# Patient Record
Sex: Male | Born: 1990 | Race: Black or African American | Hispanic: No | Marital: Single | Smoking: Current every day smoker
Health system: Southern US, Community
[De-identification: ages and names within clinical notes are randomized; demographics above are authoritative.]

---
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
## 2010-01-23 ENCOUNTER — Emergency Department (HOSPITAL_COMMUNITY): Admission: EM | Admit: 2010-01-23 | Discharge: 2010-01-23 | Payer: Self-pay | Admitting: Emergency Medicine

## 2010-02-05 ENCOUNTER — Emergency Department (HOSPITAL_COMMUNITY): Admission: EM | Admit: 2010-02-05 | Discharge: 2010-02-06 | Payer: Self-pay | Admitting: Emergency Medicine

## 2010-07-18 ENCOUNTER — Emergency Department (HOSPITAL_COMMUNITY): Admission: EM | Admit: 2010-07-18 | Discharge: 2010-07-18 | Payer: Self-pay | Admitting: Emergency Medicine

## 2010-08-10 ENCOUNTER — Emergency Department (HOSPITAL_COMMUNITY): Admission: EM | Admit: 2010-08-10 | Discharge: 2010-08-10 | Payer: Self-pay | Admitting: Emergency Medicine

## 2011-09-06 LAB — EAR CULTURE

## 2011-11-04 ENCOUNTER — Emergency Department (HOSPITAL_COMMUNITY)
Admission: EM | Admit: 2011-11-04 | Discharge: 2011-11-04 | Discharge status: Against Medical Advice | Payer: Self-pay | Attending: Emergency Medicine | Admitting: Emergency Medicine

## 2011-11-04 ENCOUNTER — Encounter: Payer: Self-pay | Admitting: *Deleted

## 2011-11-04 DIAGNOSIS — R059 Cough, unspecified: Secondary | ICD-10-CM | POA: Insufficient documentation

## 2011-11-04 DIAGNOSIS — R070 Pain in throat: Secondary | ICD-10-CM | POA: Insufficient documentation

## 2011-11-04 DIAGNOSIS — R0602 Shortness of breath: Secondary | ICD-10-CM | POA: Insufficient documentation

## 2011-11-04 DIAGNOSIS — R42 Dizziness and giddiness: Secondary | ICD-10-CM | POA: Insufficient documentation

## 2011-11-04 DIAGNOSIS — R05 Cough: Secondary | ICD-10-CM | POA: Insufficient documentation

## 2011-11-04 DIAGNOSIS — R062 Wheezing: Secondary | ICD-10-CM | POA: Insufficient documentation

## 2011-11-04 MED ORDER — ALBUTEROL SULFATE (5 MG/ML) 0.5% IN NEBU
INHALATION_SOLUTION | RESPIRATORY_TRACT | Status: AC
  Filled 2011-11-04: qty 1

## 2011-11-04 NOTE — ED Notes (Signed)
Pt state that he started having difficulty breathing, SOB, sore throat, coughing , and congestion this past Saturday. Pt states that he took two albuterol nebulizer treatments prior to coming to the ER with no relief. Pt has inspiratory and expiratory wheezing from left lung, right lung has rhonchi. Pt states that he has been having a nonproductive cough. Pt states that he has had dizziness with the symptoms. Pt's throat is inflamed and red, no white blotches noted. Pt does state that he is having difficulty swallowing. Pt has a low grade fever of 99.85F at this time. O2 sats 98% on room air. Mom with patient. Pt has not taken any OTC meds for his symptoms.

## 2012-11-10 ENCOUNTER — Encounter (HOSPITAL_COMMUNITY): Payer: Self-pay | Admitting: Emergency Medicine

## 2012-11-10 ENCOUNTER — Emergency Department (INDEPENDENT_AMBULATORY_CARE_PROVIDER_SITE_OTHER)
Admission: EM | Admit: 2012-11-10 | Discharge: 2012-11-10 | Disposition: A | Discharge status: Home / Self Care | Payer: Medicaid Other | Source: Home / Self Care | Attending: Emergency Medicine | Admitting: Emergency Medicine

## 2012-11-10 DIAGNOSIS — K047 Periapical abscess without sinus: Secondary | ICD-10-CM

## 2012-11-10 MED ORDER — PENICILLIN V POTASSIUM 500 MG PO TABS: 500.0000 mg | ORAL_TABLET | Freq: Four times a day (QID) | ORAL | Status: DC

## 2012-11-10 MED ORDER — HYDROCODONE-ACETAMINOPHEN 5-325 MG PO TABS: ORAL_TABLET | ORAL | Status: DC

## 2012-11-10 MED ORDER — DICLOFENAC SODIUM 75 MG PO TBEC: 75.0000 mg | DELAYED_RELEASE_TABLET | Freq: Two times a day (BID) | ORAL | Status: DC

## 2012-11-10 NOTE — ED Provider Notes (Signed)
Chief Complaint  Patient presents with  . Dental Pain    History of Present Illness:   The patient is a 21 year old male who has had a one-week history of pain in his right ear and right jaw. It hurts him to chew. There's been no drainage from the ear or difficulty hearing. His right lower second molar has broken and it's tender. It's painful with swallowing. He has no trouble breathing. No fever, chills, headache, eye pain. No chest pain, neck pain, coughing, wheezing, or shortness of breath.  Review of Systems:  Other than noted above, the patient denies any of the following symptoms: Systemic:  No fever, chills, sweats or weight loss. ENT:  No headache, ear ache, sore throat, nasal congestion, facial pain, or swelling. Lymphatic:  No adenopathy. Lungs:  No coughing, wheezing or shortness of breath.  PMFSH:  Past medical history, family history, social history, meds, and allergies were reviewed.  Physical Exam:   Vital signs:  BP 136/93  Pulse 62  Temp 98.4 F (36.9 C) (Oral)  Resp 20  SpO2 100% General:  Alert, oriented, in no distress. ENT:  TMs and canals normal.  Nasal mucosa normal. Mouth exam:  The right lower second molar is decayed and tender to touch. There is no purulent drainage. No swelling of the gingiva. The pharynx is clear. There is no swelling of the floor the mouth.  Neck:  No swelling or adenopathy. Lungs:  Breath sounds clear and equal bilaterally.  No wheezes, rales or rhonchi. Heart:  Regular rhythm.  No gallops or murmers. Skin:  Clear, warm and dry.  Assessment:  The encounter diagnosis was Dental abscess.  Plan:   1.  The following meds were prescribed:   New Prescriptions   DICLOFENAC (VOLTAREN) 75 MG EC TABLET    Take 1 tablet (75 mg total) by mouth 2 (two) times daily.   HYDROCODONE-ACETAMINOPHEN (NORCO/VICODIN) 5-325 MG PER TABLET    1 to 2 tabs every 4 to 6 hours as needed for pain.   PENICILLIN V POTASSIUM (VEETID) 500 MG TABLET    Take 1 tablet  (500 mg total) by mouth 4 (four) times daily.   2.  The patient was instructed in symptomatic care and handouts were given. 3.  The patient was told to return if becoming worse in any way, if no better in 3 or 4 days, and given some red flag symptoms that would indicate earlier return, especially difficulty breathing. 4.  The patient was told to follow up with a dentist as soon as possible.    Reuben Likes, MD 11/10/12 [phone]

## 2012-11-10 NOTE — ED Notes (Signed)
Pt  Reports  Pain  r   Side  Side  Face   And  r      Side  Face         And  r           Ear     With  Symptoms  Of  toothace  As  Well  -  Symptoms  X      5    Days   He  Is  Sitting  Upright on  Exam table  He  Is  Speaking in  Complete  sentances  And  Is  In no  Severe  Distress

## 2012-11-12 ENCOUNTER — Emergency Department (HOSPITAL_COMMUNITY)
Admission: EM | Admit: 2012-11-12 | Discharge: 2012-11-12 | Disposition: A | Discharge status: Home / Self Care | Payer: Medicaid Other | Attending: Emergency Medicine | Admitting: Emergency Medicine

## 2012-11-12 ENCOUNTER — Encounter (HOSPITAL_COMMUNITY): Payer: Self-pay | Admitting: Emergency Medicine

## 2012-11-12 DIAGNOSIS — K089 Disorder of teeth and supporting structures, unspecified: Secondary | ICD-10-CM | POA: Insufficient documentation

## 2012-11-12 DIAGNOSIS — K0889 Other specified disorders of teeth and supporting structures: Secondary | ICD-10-CM

## 2012-11-12 DIAGNOSIS — F17200 Nicotine dependence, unspecified, uncomplicated: Secondary | ICD-10-CM | POA: Insufficient documentation

## 2012-11-12 DIAGNOSIS — J45909 Unspecified asthma, uncomplicated: Secondary | ICD-10-CM | POA: Insufficient documentation

## 2012-11-12 MED ORDER — OXYCODONE-ACETAMINOPHEN 5-325 MG PO TABS
1.0000 | ORAL_TABLET | Freq: Once | ORAL | Status: AC
  Administered 2012-11-12: 1 via ORAL
  Filled 2012-11-12: qty 1

## 2012-11-12 MED ORDER — OXYCODONE-ACETAMINOPHEN 5-325 MG PO TABS: ORAL_TABLET | ORAL | Status: DC

## 2012-11-12 MED ORDER — AMOXICILLIN 500 MG PO CAPS: 1000.0000 mg | ORAL_CAPSULE | Freq: Two times a day (BID) | ORAL | Status: DC

## 2012-11-12 NOTE — ED Notes (Signed)
Pt arrives to ed c/o toothache x 2days.  Pt able to swallow own secretions.  No facial swelling, bleed, discharge.

## 2012-11-13 NOTE — ED Provider Notes (Signed)
Medical screening examination/treatment/procedure(s) were performed by non-physician practitioner and as supervising physician I was immediately available for consultation/collaboration.   Gerhard Munch, MD 11/13/12 [phone]

## 2012-11-13 NOTE — ED Provider Notes (Signed)
History     CSN: [id number]  Arrival date & time 11/12/12  5366   First MD Initiated Contact with Patient 11/12/12 1019      Chief Complaint  Patient presents with  . Dental Pain    (Consider location/radiation/quality/duration/timing/severity/associated sxs/prior treatment) HPI  Thomas Houston is a 21 y.o. male complaining of right lower jaw tooth pain rated at 5/10 x7 days. Pain is ecacerbated by chewing. Pt denies fever, N/V. Pt has not taken any pain Rx except for motrin with no releif. He was seen by [facility] and given script, but he has not filled it because it is not covered by insurance.   Past Medical History  Diagnosis Date  . Asthma     History reviewed. No pertinent past surgical history.  Family History  Problem Relation Age of Onset  . Asthma Mother   . Hypertension Mother     History  Substance Use Topics  . Smoking status: Current Some Day Smoker -- 3.0 packs/day  . Smokeless tobacco: Never Used  . Alcohol Use: No      Review of Systems  Allergies  Other  Home Medications   Current Outpatient Rx  Name  Route  Sig  Dispense  Refill  . AMOXICILLIN 500 MG PO CAPS   Oral   Take 2 capsules (1,000 mg total) by mouth 2 (two) times daily.   40 capsule   0   . OXYCODONE-ACETAMINOPHEN 5-325 MG PO TABS      1 to 2 tabs PO q6hrs  PRN for pain   15 tablet   0     BP 126/83  Pulse 54  Temp 97.5 F (36.4 C) (Oral)  Resp 20  SpO2 100%  Physical Exam  Nursing note and vitals reviewed. Constitutional: He is oriented to person, place, and time. He appears well-developed and well-nourished. No distress.  HENT:  Head: Normocephalic.  Mouth/Throat: Oropharynx is clear and moist.       No trismus, mild edema to right buccal mucosa. Poor dentition with multiple caries  Eyes: Conjunctivae normal and EOM are normal.  Cardiovascular: Normal rate.   Pulmonary/Chest: Effort normal. No stridor.  Abdominal: Soft.  Musculoskeletal: Normal range of motion.   Lymphadenopathy:    He has cervical adenopathy.  Neurological: He is alert and oriented to person, place, and time.  Psychiatric: He has a normal mood and affect.    ED Course  Procedures (including critical care time)  Labs Reviewed - No data to display No results found.   1. Pain, dental       MDM  Encouraged Pt to follow with dental referral.   Gave resources for low-cost pharmacies.   Pt verbalized understanding and agrees with care plan. Outpatient follow-up and return precautions given.    New Prescriptions   AMOXICILLIN (AMOXIL) 500 MG CAPSULE    Take 2 capsules (1,000 mg total) by mouth 2 (two) times daily.   OXYCODONE-ACETAMINOPHEN (PERCOCET/ROXICET) 5-325 MG PER TABLET    1 to 2 tabs PO q6hrs  PRN for pain          Wynetta Emery, PA-C 11/13/12 1226

## 2013-08-18 ENCOUNTER — Emergency Department (INDEPENDENT_AMBULATORY_CARE_PROVIDER_SITE_OTHER)
Admission: EM | Admit: 2013-08-18 | Discharge: 2013-08-18 | Disposition: A | Discharge status: Home / Self Care | Payer: Self-pay | Source: Home / Self Care | Attending: Emergency Medicine | Admitting: Emergency Medicine

## 2013-08-18 ENCOUNTER — Encounter (HOSPITAL_COMMUNITY): Payer: Self-pay | Admitting: Emergency Medicine

## 2013-08-18 ENCOUNTER — Emergency Department (INDEPENDENT_AMBULATORY_CARE_PROVIDER_SITE_OTHER): Payer: Medicaid Other

## 2013-08-18 DIAGNOSIS — J45909 Unspecified asthma, uncomplicated: Secondary | ICD-10-CM

## 2013-08-18 DIAGNOSIS — J020 Streptococcal pharyngitis: Secondary | ICD-10-CM

## 2013-08-18 LAB — POCT RAPID STREP A: Streptococcus, Group A Screen (Direct): POSITIVE — AB

## 2013-08-18 MED ORDER — ALBUTEROL SULFATE (5 MG/ML) 0.5% IN NEBU
5.0000 mg | INHALATION_SOLUTION | Freq: Once | RESPIRATORY_TRACT | Status: AC
  Administered 2013-08-18: 5 mg via RESPIRATORY_TRACT

## 2013-08-18 MED ORDER — METHYLPREDNISOLONE ACETATE 80 MG/ML IJ SUSP
80.0000 mg | Freq: Once | INTRAMUSCULAR | Status: AC
  Administered 2013-08-18: 80 mg via INTRAMUSCULAR

## 2013-08-18 MED ORDER — ALBUTEROL SULFATE HFA 108 (90 BASE) MCG/ACT IN AERS: 2.0000 | INHALATION_SPRAY | RESPIRATORY_TRACT | Status: DC | PRN

## 2013-08-18 MED ORDER — HYDROCODONE-ACETAMINOPHEN 5-325 MG PO TABS
2.0000 | ORAL_TABLET | Freq: Once | ORAL | Status: AC
  Administered 2013-08-18: 2 via ORAL

## 2013-08-18 MED ORDER — GUAIFENESIN-CODEINE 100-10 MG/5ML PO SYRP: 10.0000 mL | ORAL_SOLUTION | Freq: Four times a day (QID) | ORAL | Status: DC | PRN

## 2013-08-18 MED ORDER — OXYCODONE-ACETAMINOPHEN 5-325 MG PO TABS: ORAL_TABLET | ORAL | Status: DC

## 2013-08-18 MED ORDER — HYDROCODONE-ACETAMINOPHEN 5-325 MG PO TABS
ORAL_TABLET | ORAL | Status: AC
  Filled 2013-08-18: qty 2

## 2013-08-18 MED ORDER — IPRATROPIUM BROMIDE 0.02 % IN SOLN
0.5000 mg | Freq: Once | RESPIRATORY_TRACT | Status: AC
  Administered 2013-08-18: 0.5 mg via RESPIRATORY_TRACT

## 2013-08-18 MED ORDER — AMOXICILLIN 500 MG PO CAPS: 500.0000 mg | ORAL_CAPSULE | Freq: Three times a day (TID) | ORAL | Status: DC

## 2013-08-18 MED ORDER — METHYLPREDNISOLONE ACETATE 80 MG/ML IJ SUSP
INTRAMUSCULAR | Status: AC
  Filled 2013-08-18: qty 1

## 2013-08-18 MED ORDER — ALBUTEROL SULFATE (5 MG/ML) 0.5% IN NEBU
INHALATION_SOLUTION | RESPIRATORY_TRACT | Status: AC
  Filled 2013-08-18: qty 1

## 2013-08-18 MED ORDER — PREDNISONE 20 MG PO TABS: 20.0000 mg | ORAL_TABLET | Freq: Two times a day (BID) | ORAL | Status: DC

## 2013-08-18 NOTE — ED Notes (Signed)
Pt c/o cold sxs onset 3 days Sxs include: fevers, wheezing, SOB, coughing, congestion, diarrhea, vomiting Taking tyle w/no relief He is alert w/no signs of acute distress.

## 2013-08-18 NOTE — ED Notes (Signed)
Went to get patient for  CXR. Patient had just started breathing treatment

## 2013-08-18 NOTE — ED Provider Notes (Signed)
Chief Complaint:   Chief Complaint  Patient presents with  . URI    History of Present Illness:   Thomas Houston is a 22 year old male with asthma who has had a three-day history of sore throat, pain with swallowing, dry cough, chest tightness, and wheezing. His asthma has been lifelong and is usually controlled with as needed albuterol which he uses infrequently. He's also had nausea, vomiting, left upper quadrant abdominal and left lower anterolateral chest wall pain, temperature of up to 102, sweats, chills, aches, headache, nasal congestion with yellowish rhinorrhea.  Review of Systems:  Other than noted above, the patient denies any of the following symptoms: Systemic:  No fevers, chills, sweats, weight loss or gain, fatigue, or tiredness. Eye:  No redness or discharge. ENT:  No ear pain, drainage, headache, nasal congestion, drainage, sinus pressure, difficulty swallowing, or sore throat. Neck:  No neck pain or swollen glands. Lungs:  No cough, sputum production, hemoptysis, wheezing, chest tightness, shortness of breath or chest pain. GI:  No abdominal pain, nausea, vomiting or diarrhea.  PMFSH:  Past medical history, family history, social history, meds, and allergies were reviewed.   Physical Exam:   Vital signs:  BP 133/77  Pulse 83  Temp(Src) 100.2 F (37.9 C) (Oral)  Resp 83  SpO2 98% General:  Alert and oriented.  In no distress.  Skin warm and dry. Eye:  No conjunctival injection or drainage. Lids were normal. ENT:  TMs and canals were normal, without erythema or inflammation.  Nasal mucosa was clear and uncongested, without drainage.  Mucous membranes were moist.  Pharynx was erythematous with no exudate or drainage.  There were no oral ulcerations or lesions. Neck:  Supple, no adenopathy, tenderness or mass. Lungs:  No respiratory distress.  He has scattered wheezes on expiration bilaterally, no rales or rhonchi, good air movement.  Heart:  Regular rhythm, without  gallops, murmers or rubs. Skin:  Clear, warm, and dry, without rash or lesions.  Labs:   Results for orders placed during the hospital encounter of 08/18/13  POCT RAPID STREP A ([facility] ONLY)      Result Value Range   Streptococcus, Group A Screen (Direct) POSITIVE (*) NEGATIVE     Radiology:  Dg Chest 2 View  08/18/2013   *RADIOLOGY REPORT*  Clinical Data: Cough for 3 days.  Chest.  CHEST - 2 VIEW  Comparison: 02/06/2010  Findings: Two-view study shows no focal airspace consolidation.  No pulmonary edema or pleural effusion.  No evidence for pneumothorax. The cardiopericardial silhouette is within normal limits for size. Imaged bony structures of the thorax are intact.  IMPRESSION: Stable.  Normal exam.   Original Report Authenticated By: Kennith Center, M.D.    Course in Urgent Care Center:   He was given a DuoNeb breathing treatment and Depo-Medrol 80 mg IM. Thereafter he felt better and his lungs were clear and wheeze free.  Assessment:  The primary encounter diagnosis was Strep throat. A diagnosis of Asthma was also pertinent to this visit.  Plan:   1.  Meds:  The following meds were prescribed:   New Prescriptions   ALBUTEROL (PROVENTIL HFA;VENTOLIN HFA) 108 (90 BASE) MCG/ACT INHALER    Inhale 2 puffs into the lungs every 4 (four) hours as needed for wheezing.   AMOXICILLIN (AMOXIL) 500 MG CAPSULE    Take 1 capsule (500 mg total) by mouth 3 (three) times daily.   GUAIFENESIN-CODEINE (GUIATUSS AC) 100-10 MG/5ML SYRUP    Take 10  mLs by mouth 4 (four) times daily as needed for cough.   OXYCODONE-ACETAMINOPHEN (PERCOCET) 5-325 MG PER TABLET    1 to 2 tablets every 6 hours as needed for pain.   PREDNISONE (DELTASONE) 20 MG TABLET    Take 1 tablet (20 mg total) by mouth 2 (two) times daily.    2.  Patient Education/Counseling:  The patient was given appropriate handouts, self care instructions, and instructed in symptomatic relief.  Suggested hot salt gargles and throat lozenges. Should  take infectious precautions for the strep. Trade out toothbrush in 24 hours.  3.  Follow up:  The patient was told to follow up if no better in 3 to 4 days, if becoming worse in any way, and given some red flag symptoms such as increasing difficulty breathing which would prompt immediate return.  Follow up here if necessary.      Reuben Likes, MD 08/18/13 [phone]

## 2014-10-06 ENCOUNTER — Encounter (HOSPITAL_COMMUNITY): Payer: Self-pay | Admitting: *Deleted

## 2014-10-06 ENCOUNTER — Emergency Department (HOSPITAL_COMMUNITY): Payer: Medicaid Other

## 2014-10-06 ENCOUNTER — Emergency Department (HOSPITAL_COMMUNITY)
Admission: EM | Admit: 2014-10-06 | Discharge: 2014-10-06 | Disposition: A | Discharge status: Home / Self Care | Payer: Medicaid Other | Attending: Emergency Medicine | Admitting: Emergency Medicine

## 2014-10-06 DIAGNOSIS — Z79899 Other long term (current) drug therapy: Secondary | ICD-10-CM | POA: Insufficient documentation

## 2014-10-06 DIAGNOSIS — Z792 Long term (current) use of antibiotics: Secondary | ICD-10-CM | POA: Insufficient documentation

## 2014-10-06 DIAGNOSIS — R0602 Shortness of breath: Secondary | ICD-10-CM

## 2014-10-06 DIAGNOSIS — J45901 Unspecified asthma with (acute) exacerbation: Secondary | ICD-10-CM | POA: Insufficient documentation

## 2014-10-06 DIAGNOSIS — Z720 Tobacco use: Secondary | ICD-10-CM | POA: Insufficient documentation

## 2014-10-06 DIAGNOSIS — M791 Myalgia: Secondary | ICD-10-CM | POA: Insufficient documentation

## 2014-10-06 LAB — CBC WITH DIFFERENTIAL/PLATELET
BASOS ABS: 0 10*3/uL (ref 0.0–0.1)
Basophils Relative: 1 % (ref 0–1)
Eosinophils Absolute: 0.1 10*3/uL (ref 0.0–0.7)
Eosinophils Relative: 2 % (ref 0–5)
HEMATOCRIT: 44.1 % (ref 39.0–52.0)
HEMOGLOBIN: 14.5 g/dL (ref 13.0–17.0)
LYMPHS ABS: 1.8 10*3/uL (ref 0.7–4.0)
LYMPHS PCT: 42 % (ref 12–46)
MCH: 29.1 pg (ref 26.0–34.0)
MCHC: 32.9 g/dL (ref 30.0–36.0)
MCV: 88.6 fL (ref 78.0–100.0)
Monocytes Absolute: 0.7 10*3/uL (ref 0.1–1.0)
Monocytes Relative: 17 % — ABNORMAL HIGH (ref 3–12)
NEUTROS ABS: 1.6 10*3/uL — AB (ref 1.7–7.7)
NEUTROS PCT: 38 % — AB (ref 43–77)
PLATELETS: 246 10*3/uL (ref 150–400)
RBC: 4.98 MIL/uL (ref 4.22–5.81)
RDW: 13.1 % (ref 11.5–15.5)
WBC: 4.3 10*3/uL (ref 4.0–10.5)

## 2014-10-06 LAB — BASIC METABOLIC PANEL
ANION GAP: 13 (ref 5–15)
BUN: 9 mg/dL (ref 6–23)
CALCIUM: 9.3 mg/dL (ref 8.4–10.5)
CO2: 25 meq/L (ref 19–32)
Chloride: 100 mEq/L (ref 96–112)
Creatinine, Ser: 1.06 mg/dL (ref 0.50–1.35)
GFR calc Af Amer: >90 mL/min (ref >90)
GFR calc non Af Amer: >90 mL/min (ref >90)
GLUCOSE: 85 mg/dL (ref 70–99)
POTASSIUM: 3.7 meq/L (ref 3.7–5.3)
SODIUM: 138 meq/L (ref 137–147)

## 2014-10-06 MED ORDER — IPRATROPIUM BROMIDE 0.02 % IN SOLN
0.5000 mg | Freq: Once | RESPIRATORY_TRACT | Status: AC
  Administered 2014-10-06: 0.5 mg via RESPIRATORY_TRACT
  Filled 2014-10-06: qty 2.5

## 2014-10-06 MED ORDER — METHYLPREDNISOLONE SODIUM SUCC 125 MG IJ SOLR
125.0000 mg | Freq: Once | INTRAMUSCULAR | Status: AC
  Administered 2014-10-06: 125 mg via INTRAVENOUS
  Filled 2014-10-06: qty 2

## 2014-10-06 MED ORDER — ALBUTEROL SULFATE (2.5 MG/3ML) 0.083% IN NEBU
5.0000 mg | INHALATION_SOLUTION | Freq: Once | RESPIRATORY_TRACT | Status: AC
  Administered 2014-10-06: 5 mg via RESPIRATORY_TRACT
  Filled 2014-10-06: qty 6

## 2014-10-06 MED ORDER — ALBUTEROL SULFATE HFA 108 (90 BASE) MCG/ACT IN AERS
2.0000 | INHALATION_SPRAY | RESPIRATORY_TRACT | Status: DC
  Administered 2014-10-06: 2 via RESPIRATORY_TRACT
  Filled 2014-10-06: qty 6.7

## 2014-10-06 MED ORDER — MORPHINE SULFATE 4 MG/ML IJ SOLN
4.0000 mg | Freq: Once | INTRAMUSCULAR | Status: AC
  Administered 2014-10-06: 4 mg via INTRAVENOUS
  Filled 2014-10-06: qty 1

## 2014-10-06 MED ORDER — PREDNISONE 10 MG PO TABS: 60.0000 mg | ORAL_TABLET | Freq: Every day | ORAL | Status: DC

## 2014-10-06 NOTE — ED Notes (Signed)
The pt has had a cough and cold for 2 days.  Non-productive cough headache weakness and he may have had a temp

## 2014-10-06 NOTE — ED Notes (Signed)
Campos, MD at bedside.  

## 2014-10-06 NOTE — Discharge Instructions (Signed)

## 2014-10-06 NOTE — ED Provider Notes (Signed)
CSN: [id number]     Arrival date & time 10/06/14  0107 History   First MD Initiated Contact with Patient 10/06/14 [phone]     Chief Complaint  Patient presents with  . multiple complaints       HPI Patient presents to the emergency department complaining of upper respiratory symptoms as well as productive cough for the past several days.  He has a history of asthma and reports trying albuterol at home without improvement in his symptoms today.  His had generalized myalgias.  He denies chest pain.  Denies abdominal pain.  No nausea vomiting or diarrhea.  Subjective fever at home.  Symptoms are mild to moderate in severity.   Past Medical History  Diagnosis Date  . Asthma    History reviewed. No pertinent past surgical history. Family History  Problem Relation Age of Onset  . Asthma Mother   . Hypertension Mother    History  Substance Use Topics  . Smoking status: Current Some Day Smoker -- 3.00 packs/day  . Smokeless tobacco: Never Used  . Alcohol Use: No    Review of Systems  All other systems reviewed and are negative.     Allergies  Other  Home Medications   Prior to Admission medications   Medication Sig Start Date End Date Taking? Authorizing Provider  albuterol (PROVENTIL) (2.5 MG/3ML) 0.083% nebulizer solution Take 2.5 mg by nebulization every 6 (six) hours as needed for wheezing or shortness of breath.   Yes Historical Provider, MD  diphenhydrAMINE (BENADRYL) 25 mg capsule Take 25 mg by mouth every 6 (six) hours as needed for allergies.   Yes Historical Provider, MD  albuterol (PROVENTIL HFA;VENTOLIN HFA) 108 (90 BASE) MCG/ACT inhaler Inhale 2 puffs into the lungs every 4 (four) hours as needed for wheezing. 08/18/13   Reuben Likesavid C Keller, MD  amoxicillin (AMOXIL) 500 MG capsule Take 2 capsules (1,000 mg total) by mouth 2 (two) times daily. 11/12/12   Nicole Pisciotta, PA-C  amoxicillin (AMOXIL) 500 MG capsule Take 1 capsule (500 mg total) by mouth 3 (three) times daily.  08/18/13   Reuben Likesavid C Keller, MD  guaiFENesin-codeine [facility](GUIATUSS AC) 100-10 MG/5ML syrup Take 10 mLs by mouth 4 (four) times daily as needed for cough. 08/18/13   Reuben Likesavid C Keller, MD  oxyCODONE-acetaminophen (PERCOCET) 5-325 MG per tablet 1 to 2 tablets every 6 hours as needed for pain. 08/18/13   Reuben Likesavid C Keller, MD  oxyCODONE-acetaminophen (PERCOCET/ROXICET) 5-325 MG per tablet 1 to 2 tabs PO q6hrs  PRN for pain 11/12/12   Joni ReiningNicole Pisciotta, PA-C  predniSONE (DELTASONE) 10 MG tablet Take 6 tablets (60 mg total) by mouth daily. 10/06/14   Lyanne CoKevin M Yvon Mccord, MD   BP 154/92 mmHg  Pulse 90  Temp(Src) 99.2 F (37.3 C) (Oral)  Resp 17  Ht 6' (1.829 m)  Wt 165 lb (74.844 kg)  BMI 22.37 kg/m2  SpO2 97% Physical Exam  Constitutional: He is oriented to person, place, and time. He appears well-developed and well-nourished.  HENT:  Head: Normocephalic and atraumatic.  Eyes: EOM are normal.  Neck: Normal range of motion.  Cardiovascular: Normal rate, regular rhythm, normal heart sounds and intact distal pulses.   Pulmonary/Chest: Effort normal. No respiratory distress. He has wheezes.  Abdominal: Soft. He exhibits no distension. There is no tenderness.  Musculoskeletal: Normal range of motion. He exhibits no edema or tenderness.  Neurological: He is alert and oriented to person, place, and time.  Skin: Skin is warm and dry.  Psychiatric:  He has a normal mood and affect. Judgment normal.  Nursing note and vitals reviewed.   ED Course  Procedures (including critical care time) Labs Review Labs Reviewed  CBC WITH DIFFERENTIAL - Abnormal; Notable for the following:    Neutrophils Relative % 38 (*)    Neutro Abs 1.6 (*)    Monocytes Relative 17 (*)    All other components within normal limits  BASIC METABOLIC PANEL    Imaging Review Dg Chest 2 View  10/06/2014   CLINICAL DATA:  Shortness of breath, mid chest pain, cough and cold. Symptoms began 2 days ago.  EXAM: CHEST  2 VIEW  COMPARISON:  Chest  radiograph August 18, 2013  FINDINGS: The heart size and mediastinal contours are within normal limits. Both lungs are clear. The visualized skeletal structures are unremarkable.  IMPRESSION: No active cardiopulmonary disease.   Electronically Signed   By: Awilda Metroourtnay  Bloomer   On: 10/06/2014 04:17  I personally reviewed the imaging tests through PACS system I reviewed available ER/hospitalization records through the EMR    EKG Interpretation None      MDM   Final diagnoses:  SOB (shortness of breath)  Asthma exacerbation    5:30 AM Feels much better at this time.  Suspect bronchitis with asthma exacerbation.  Home with prednisone and albuterol.  He understands to return to the ER for new or worsening symptoms.    Lyanne CoKevin M Abhay Godbolt, MD 10/06/14 [phone]

## 2014-10-06 NOTE — ED Notes (Signed)
Pt a/o x 4 on d/c with [facility].

## 2014-11-11 ENCOUNTER — Encounter (HOSPITAL_COMMUNITY): Payer: Self-pay | Admitting: Emergency Medicine

## 2014-11-11 ENCOUNTER — Emergency Department (HOSPITAL_COMMUNITY): Payer: Medicaid Other

## 2014-11-11 ENCOUNTER — Emergency Department (HOSPITAL_COMMUNITY)
Admission: EM | Admit: 2014-11-11 | Discharge: 2014-11-11 | Disposition: A | Discharge status: Home / Self Care | Payer: Medicaid Other | Attending: Emergency Medicine | Admitting: Emergency Medicine

## 2014-11-11 DIAGNOSIS — J45901 Unspecified asthma with (acute) exacerbation: Secondary | ICD-10-CM | POA: Insufficient documentation

## 2014-11-11 DIAGNOSIS — J069 Acute upper respiratory infection, unspecified: Secondary | ICD-10-CM | POA: Insufficient documentation

## 2014-11-11 DIAGNOSIS — Z720 Tobacco use: Secondary | ICD-10-CM | POA: Insufficient documentation

## 2014-11-11 DIAGNOSIS — Z792 Long term (current) use of antibiotics: Secondary | ICD-10-CM | POA: Insufficient documentation

## 2014-11-11 DIAGNOSIS — Z79899 Other long term (current) drug therapy: Secondary | ICD-10-CM | POA: Insufficient documentation

## 2014-11-11 DIAGNOSIS — Z7952 Long term (current) use of systemic steroids: Secondary | ICD-10-CM | POA: Insufficient documentation

## 2014-11-11 MED ORDER — ALBUTEROL SULFATE (2.5 MG/3ML) 0.083% IN NEBU: 2.5000 mg | INHALATION_SOLUTION | RESPIRATORY_TRACT | Status: DC | PRN

## 2014-11-11 MED ORDER — ACETAMINOPHEN 500 MG PO TABS
1000.0000 mg | ORAL_TABLET | Freq: Once | ORAL | Status: AC
  Administered 2014-11-11: 1000 mg via ORAL
  Filled 2014-11-11: qty 2

## 2014-11-11 MED ORDER — ALBUTEROL SULFATE HFA 108 (90 BASE) MCG/ACT IN AERS
2.0000 | INHALATION_SPRAY | Freq: Once | RESPIRATORY_TRACT | Status: AC
  Administered 2014-11-11: 2 via RESPIRATORY_TRACT
  Filled 2014-11-11: qty 6.7

## 2014-11-11 NOTE — ED Provider Notes (Signed)
CSN: [id number]     Arrival date & time 11/11/14  [id number] History   First MD Initiated Contact with Patient 11/11/14 [phone]     Chief Complaint  Patient presents with  . Shortness of Breath     (Consider location/radiation/quality/duration/timing/severity/associated sxs/prior Treatment) HPI  23 year old male presents with cough, wheezing, right-sided chest pain since yesterday. He states started with a cough and a nose pain with coughing and inspiration. The pain now lasts at all times. He rates the pain as severe. The patient has not had any fever. The cough is a dry cough. Denies any hemoptysis, recent surgery, history of DVT, leg swelling or leg pain. No prior history of cancer. He does have albuterol nebulizer at home but states he is running out. Does smoke heavily per day. Patient has not taken anything for the pain.  Past Medical History  Diagnosis Date  . Asthma    History reviewed. No pertinent past surgical history. Family History  Problem Relation Age of Onset  . Asthma Mother   . Hypertension Mother    History  Substance Use Topics  . Smoking status: Current Some Day Smoker -- 3.00 packs/day    Types: Cigarettes  . Smokeless tobacco: Never Used  . Alcohol Use: No    Review of Systems  Constitutional: Negative for fever.  HENT: Negative for congestion.   Respiratory: Positive for cough, shortness of breath and wheezing.   Cardiovascular: Positive for chest pain. Negative for leg swelling.  Gastrointestinal: Negative for vomiting.  All other systems reviewed and are negative.     Allergies  Other  Home Medications   Prior to Admission medications   Medication Sig Start Date End Date Taking? Authorizing Provider  albuterol (PROVENTIL HFA;VENTOLIN HFA) 108 (90 BASE) MCG/ACT inhaler Inhale 2 puffs into the lungs every 4 (four) hours as needed for wheezing. 08/18/13   Reuben Likesavid C Keller, MD  albuterol (PROVENTIL) (2.5 MG/3ML) 0.083% nebulizer solution Take 2.5 mg by  nebulization every 6 (six) hours as needed for wheezing or shortness of breath.    Historical Provider, MD  amoxicillin (AMOXIL) 500 MG capsule Take 2 capsules (1,000 mg total) by mouth 2 (two) times daily. 11/12/12   Nicole Pisciotta, PA-C  amoxicillin (AMOXIL) 500 MG capsule Take 1 capsule (500 mg total) by mouth 3 (three) times daily. 08/18/13   Reuben Likesavid C Keller, MD  diphenhydrAMINE (BENADRYL) 25 mg capsule Take 25 mg by mouth every 6 (six) hours as needed for allergies.    Historical Provider, MD  guaiFENesin-codeine (GUIATUSS AC) 100-10 MG/5ML syrup Take 10 mLs by mouth 4 (four) times daily as needed for cough. 08/18/13   Reuben Likesavid C Keller, MD  oxyCODONE-acetaminophen (PERCOCET) 5-325 MG per tablet 1 to 2 tablets every 6 hours as needed for pain. 08/18/13   Reuben Likesavid C Keller, MD  oxyCODONE-acetaminophen (PERCOCET/ROXICET) 5-325 MG per tablet 1 to 2 tabs PO q6hrs  PRN for pain 11/12/12   Joni ReiningNicole Pisciotta, PA-C  predniSONE (DELTASONE) 10 MG tablet Take 6 tablets (60 mg total) by mouth daily. 10/06/14   Lyanne CoKevin M Campos, MD   BP 135/68 mmHg  Pulse 62  Temp(Src) 97.8 F (36.6 C) (Oral)  Resp 16  Ht 6' (1.829 m)  Wt 175 lb (79.379 kg)  BMI 23.73 kg/m2  SpO2 98% Physical Exam  Constitutional: He is oriented to person, place, and time. He appears well-developed and well-nourished. No distress.  Lying comfortably in the bed  HENT:  Head: Normocephalic and atraumatic.  Right Ear: External  ear normal.  Left Ear: External ear normal.  Nose: Nose normal.  Eyes: Right eye exhibits no discharge. Left eye exhibits no discharge.  Neck: Neck supple.  Cardiovascular: Normal rate, regular rhythm, normal heart sounds and intact distal pulses.   Pulmonary/Chest: Effort normal and breath sounds normal. He has no wheezes. He exhibits no tenderness.  No increased work of breathing.  Abdominal: Soft. There is no tenderness.  Musculoskeletal: He exhibits no edema or tenderness.  No leg swelling or leg tenderness   Neurological: He is alert and oriented to person, place, and time.  Skin: Skin is warm and dry. He is not diaphoretic.  Nursing note and vitals reviewed.   ED Course  Procedures (including critical care time) Labs Review Labs Reviewed - No data to display  Imaging Review Dg Chest 2 View  11/11/2014   CLINICAL DATA:  Cough, right-sided chest pain.  EXAM: CHEST  2 VIEW  COMPARISON:  October 06, 2014.  FINDINGS: The heart size and mediastinal contours are within normal limits. Both lungs are clear. No pneumothorax or pleural effusion is noted. The visualized skeletal structures are unremarkable.  IMPRESSION: No acute cardiopulmonary abnormality seen.   Electronically Signed   By: Roque LiasJames  Green M.D.   On: 11/11/2014 09:56     EKG Interpretation   Date/Time:  Friday November 11 2014 09:13:12 EST Ventricular Rate:  56 PR Interval:  130 QRS Duration: 87 QT Interval:  398 QTC Calculation: 384 R Axis:   70 Text Interpretation:  Sinus rhythm Lateral infarct, acute (LAD) Probable  anteroseptal infarct, old Baseline wander in lead(s) V6 ST elevation  consistent with early repol No old tracing to compare Confirmed by  Ameerah Huffstetler  MD, Analiz Tvedt ([id number]) on 11/11/2014 9:15:25 AM      MDM   Final diagnoses:  Upper respiratory infection    Patient's CP is likely from his coughing and likely URI. No PNA on chest xray. No significant wheezing or respiratory distress at this time. Will give inhaler and discharge with albuterol nebulizer refill. Patient is low risk for PE and is PERC negative. Not c/w ACS or dissection. Will recommend NSAIDs/tylenol and albuterol prn with anti-tussives.     Audree CamelScott T Orestes Geiman, MD 11/11/14 1500

## 2014-11-11 NOTE — ED Notes (Addendum)
Pt reports pain with respiration beginning yesterday.  Pt denies fever, productive cough.  Pt current smoker. Pt has hx of asthma, states no inhaler at this time.  NAD noted at this time.  Breathing is unlabored, symmetric.

## 2014-11-11 NOTE — Discharge Instructions (Signed)
Cough, Adult  A cough is a reflex that helps clear your throat and airways. It can help heal the body or may be a reaction to an irritated airway. A cough may only last 2 or 3 weeks (acute) or may last more than 8 weeks (chronic).  CAUSES Acute cough:  Viral or bacterial infections. Chronic cough:  Infections.  Allergies.  Asthma.  Post-nasal drip.  Smoking.  Heartburn or acid reflux.  Some medicines.  Chronic lung problems (COPD).  Cancer. SYMPTOMS   Cough.  Fever.  Chest pain.  Increased breathing rate.  High-pitched whistling sound when breathing (wheezing).  Colored mucus that you cough up (sputum). TREATMENT   A bacterial cough may be treated with antibiotic medicine.  A viral cough must run its course and will not respond to antibiotics.  Your caregiver may recommend other treatments if you have a chronic cough. HOME CARE INSTRUCTIONS   Only take over-the-counter or prescription medicines for pain, discomfort, or fever as directed by your caregiver. Use cough suppressants only as directed by your caregiver.  Use a cold steam vaporizer or humidifier in your bedroom or home to help loosen secretions.  Sleep in a semi-upright position if your cough is worse at night.  Rest as needed.  Stop smoking if you smoke. SEEK IMMEDIATE MEDICAL CARE IF:   You have pus in your sputum.  Your cough starts to worsen.  You cannot control your cough with suppressants and are losing sleep.  You begin coughing up blood.  You have difficulty breathing.  You develop pain which is getting worse or is uncontrolled with medicine.  You have a fever. MAKE SURE YOU:   Understand these instructions.  Will watch your condition.  Will get help right away if you are not doing well or get worse. Document Released: 05/17/2011 Document Revised: 02/10/2012 Document Reviewed: 05/17/2011 [facility] Patient Information 2015 [place], [place]. This information is not intended  to replace advice given to you by your health care provider. Make sure you discuss any questions you have with your health care provider.    Upper Respiratory Infection, Adult An upper respiratory infection (URI) is also sometimes known as the common cold. The upper respiratory tract includes the nose, sinuses, throat, trachea, and bronchi. Bronchi are the airways leading to the lungs. Most people improve within 1 week, but symptoms can last up to 2 weeks. A residual cough may last even longer.  CAUSES Many different viruses can infect the tissues lining the upper respiratory tract. The tissues become irritated and inflamed and often become very moist. Mucus production is also common. A cold is contagious. You can easily spread the virus to others by oral contact. This includes kissing, sharing a glass, coughing, or sneezing. Touching your mouth or nose and then touching a surface, which is then touched by another person, can also spread the virus. SYMPTOMS  Symptoms typically develop 1 to 3 days after you come in contact with a cold virus. Symptoms vary from person to person. They may include:  Runny nose.  Sneezing.  Nasal congestion.  Sinus irritation.  Sore throat.  Loss of voice (laryngitis).  Cough.  Fatigue.  Muscle aches.  Loss of appetite.  Headache.  Low-grade fever. DIAGNOSIS  You might diagnose your own cold based on familiar symptoms, since most people get a cold 2 to 3 times a year. Your caregiver can confirm this based on your exam. Most importantly, your caregiver can check that your symptoms are not due to  another disease such as strep throat, sinusitis, pneumonia, asthma, or epiglottitis. Blood tests, throat tests, and X-rays are not necessary to diagnose a common cold, but they may sometimes be helpful in excluding other more serious diseases. Your caregiver will decide if any further tests are required. RISKS AND COMPLICATIONS  You may be at risk for a more  severe case of the common cold if you smoke cigarettes, have chronic heart disease (such as heart failure) or lung disease (such as asthma), or if you have a weakened immune system. The very young and very old are also at risk for more serious infections. Bacterial sinusitis, middle ear infections, and bacterial pneumonia can complicate the common cold. The common cold can worsen asthma and chronic obstructive pulmonary disease (COPD). Sometimes, these complications can require emergency medical care and may be life-threatening. PREVENTION  The best way to protect against getting a cold is to practice good hygiene. Avoid oral or hand contact with people with cold symptoms. Wash your hands often if contact occurs. There is no clear evidence that vitamin C, vitamin E, echinacea, or exercise reduces the chance of developing a cold. However, it is always recommended to get plenty of rest and practice good nutrition. TREATMENT  Treatment is directed at relieving symptoms. There is no cure. Antibiotics are not effective, because the infection is caused by a virus, not by bacteria. Treatment may include:  Increased fluid intake. Sports drinks offer valuable electrolytes, sugars, and fluids.  Breathing heated mist or steam (vaporizer or shower).  Eating chicken soup or other clear broths, and maintaining good nutrition.  Getting plenty of rest.  Using gargles or lozenges for comfort.  Controlling fevers with ibuprofen or acetaminophen as directed by your caregiver.  Increasing usage of your inhaler if you have asthma. Zinc gel and zinc lozenges, taken in the first 24 hours of the common cold, can shorten the duration and lessen the severity of symptoms. Pain medicines may help with fever, muscle aches, and throat pain. A variety of non-prescription medicines are available to treat congestion and runny nose. Your caregiver can make recommendations and may suggest nasal or lung inhalers for other symptoms.    HOME CARE INSTRUCTIONS   Only take over-the-counter or prescription medicines for pain, discomfort, or fever as directed by your caregiver.  Use a warm mist humidifier or inhale steam from a shower to increase air moisture. This may keep secretions moist and make it easier to breathe.  Drink enough water and fluids to keep your urine clear or pale yellow.  Rest as needed.  Return to work when your temperature has returned to normal or as your caregiver advises. You may need to stay home longer to avoid infecting others. You can also use a face mask and careful hand washing to prevent spread of the virus. SEEK MEDICAL CARE IF:   After the first few days, you feel you are getting worse rather than better.  You need your caregiver's advice about medicines to control symptoms.  You develop chills, worsening shortness of breath, or Rother or red sputum. These may be signs of pneumonia.  You develop yellow or Friedlander nasal discharge or pain in the face, especially when you bend forward. These may be signs of sinusitis.  You develop a fever, swollen neck glands, pain with swallowing, or white areas in the back of your throat. These may be signs of strep throat. SEEK IMMEDIATE MEDICAL CARE IF:   You have a fever.  You develop severe  or persistent headache, ear pain, sinus pain, or chest pain.  You develop wheezing, a prolonged cough, cough up blood, or have a change in your usual mucus (if you have chronic lung disease).  You develop sore muscles or a stiff neck. Document Released: 05/14/2001 Document Revised: 02/10/2012 Document Reviewed: 02/23/2014 [facility] Patient Information 2015 [place], [place]. This information is not intended to replace advice given to you by your health care provider. Make sure you discuss any questions you have with your health care provider.    Asthma Asthma is a recurring condition in which the airways tighten and narrow. Asthma can make it difficult to  breathe. It can cause coughing, wheezing, and shortness of breath. Asthma episodes, also called asthma attacks, range from minor to life-threatening. Asthma cannot be cured, but medicines and lifestyle changes can help control it. CAUSES Asthma is believed to be caused by inherited (genetic) and environmental factors, but its exact cause is unknown. Asthma may be triggered by allergens, lung infections, or irritants in the air. Asthma triggers are different for each person. Common triggers include:   Animal dander.  Dust mites.  Cockroaches.  Pollen from trees or grass.  Mold.  Smoke.  Air pollutants such as dust, household cleaners, hair sprays, aerosol sprays, paint fumes, strong chemicals, or strong odors.  Cold air, weather changes, and winds (which increase molds and pollens in the air).  Strong emotional expressions such as crying or laughing hard.  Stress.  Certain medicines (such as aspirin) or types of drugs (such as beta-blockers).  Sulfites in foods and drinks. Foods and drinks that may contain sulfites include dried fruit, potato chips, and sparkling grape juice.  Infections or inflammatory conditions such as the flu, a cold, or an inflammation of the nasal membranes (rhinitis).  Gastroesophageal reflux disease (GERD).  Exercise or strenuous activity. SYMPTOMS Symptoms may occur immediately after asthma is triggered or many hours later. Symptoms include:  Wheezing.  Excessive nighttime or early morning coughing.  Frequent or severe coughing with a common cold.  Chest tightness.  Shortness of breath. DIAGNOSIS  The diagnosis of asthma is made by a review of your medical history and a physical exam. Tests may also be performed. These may include:  Lung function studies. These tests show how much air you breathe in and out.  Allergy tests.  Imaging tests such as X-rays. TREATMENT  Asthma cannot be cured, but it can usually be controlled. Treatment  involves identifying and avoiding your asthma triggers. It also involves medicines. There are 2 classes of medicine used for asthma treatment:   Controller medicines. These prevent asthma symptoms from occurring. They are usually taken every day.  Reliever or rescue medicines. These quickly relieve asthma symptoms. They are used as needed and provide short-term relief. Your health care provider will help you create an asthma action plan. An asthma action plan is a written plan for managing and treating your asthma attacks. It includes a list of your asthma triggers and how they may be avoided. It also includes information on when medicines should be taken and when their dosage should be changed. An action plan may also involve the use of a device called a peak flow meter. A peak flow meter measures how well the lungs are working. It helps you monitor your condition. HOME CARE INSTRUCTIONS   Take medicines only as directed by your health care provider. Speak with your health care provider if you have questions about how or when to take the medicines.  Use a peak flow meter as directed by your health care provider. Record and keep track of readings.  Understand and use the action plan to help minimize or stop an asthma attack without needing to seek medical care.  Control your home environment in the following ways to help prevent asthma attacks:  Do not smoke. Avoid being exposed to secondhand smoke.  Change your heating and air conditioning filter regularly.  Limit your use of fireplaces and wood stoves.  Get rid of pests (such as roaches and mice) and their droppings.  Throw away plants if you see mold on them.  Clean your floors and dust regularly. Use unscented cleaning products.  Try to have someone else vacuum for you regularly. Stay out of rooms while they are being vacuumed and for a short while afterward. If you vacuum, use a dust mask from a hardware store, a double-layered or  microfilter vacuum cleaner bag, or a vacuum cleaner with a HEPA filter.  Replace carpet with wood, tile, or vinyl flooring. Carpet can trap dander and dust.  Use allergy-proof pillows, mattress covers, and box spring covers.  Wash bed sheets and blankets every week in hot water and dry them in a dryer.  Use blankets that are made of polyester or cotton.  Clean bathrooms and kitchens with bleach. If possible, have someone repaint the walls in these rooms with mold-resistant paint. Keep out of the rooms that are being cleaned and painted.  Wash hands frequently. SEEK MEDICAL CARE IF:   You have wheezing, shortness of breath, or a cough even if taking medicine to prevent attacks.  The colored mucus you cough up (sputum) is thicker than usual.  Your sputum changes from clear or white to yellow, green, gray, or bloody.  You have any problems that may be related to the medicines you are taking (such as a rash, itching, swelling, or trouble breathing).  You are using a reliever medicine more than 2-3 times per week.  Your peak flow is still at 50-79% of your personal best after following your action plan for 1 hour.  You have a fever. SEEK IMMEDIATE MEDICAL CARE IF:   You seem to be getting worse and are unresponsive to treatment during an asthma attack.  You are short of breath even at rest.  You get short of breath when doing very little physical activity.  You have difficulty eating, drinking, or talking due to asthma symptoms.  You develop chest pain.  You develop a fast heartbeat.  You have a bluish color to your lips or fingernails.  You are light-headed, dizzy, or faint.  Your peak flow is less than 50% of your personal best. MAKE SURE YOU:   Understand these instructions.  Will watch your condition.  Will get help right away if you are not doing well or get worse. Document Released: 11/18/2005 Document Revised: 04/04/2014 Document Reviewed: 06/17/2013 [facility]  Patient Information 2015 [place], [place]. This information is not intended to replace advice given to you by your health care provider. Make sure you discuss any questions you have with your health care provider.   Chest Pain (Nonspecific) It is often hard to give a specific diagnosis for the cause of chest pain. There is always a chance that your pain could be related to something serious, such as a heart attack or a blood clot in the lungs. You need to follow up with your health care provider for further evaluation. CAUSES   Heartburn.  Pneumonia or bronchitis.  Anxiety or stress.  Inflammation around your heart (pericarditis) or lung (pleuritis or pleurisy).  A blood clot in the lung.  A collapsed lung (pneumothorax). It can develop suddenly on its own (spontaneous pneumothorax) or from trauma to the chest.  Shingles infection (herpes zoster virus). The chest wall is composed of bones, muscles, and cartilage. Any of these can be the source of the pain.  The bones can be bruised by injury.  The muscles or cartilage can be strained by coughing or overwork.  The cartilage can be affected by inflammation and become sore (costochondritis). DIAGNOSIS  Lab tests or other studies may be needed to find the cause of your pain. Your health care provider may have you take a test called an ambulatory electrocardiogram (ECG). An ECG records your heartbeat patterns over a 24-hour period. You may also have other tests, such as:  Transthoracic echocardiogram (TTE). During echocardiography, sound waves are used to evaluate how blood flows through your heart.  Transesophageal echocardiogram (TEE).  Cardiac monitoring. This allows your health care provider to monitor your heart rate and rhythm in real time.  Holter monitor. This is a portable device that records your heartbeat and can help diagnose heart arrhythmias. It allows your health care provider to track your heart activity for several days,  if needed.  Stress tests by exercise or by giving medicine that makes the heart beat faster. TREATMENT   Treatment depends on what may be causing your chest pain. Treatment may include:  Acid blockers for heartburn.  Anti-inflammatory medicine.  Pain medicine for inflammatory conditions.  Antibiotics if an infection is present.  You may be advised to change lifestyle habits. This includes stopping smoking and avoiding alcohol, caffeine, and chocolate.  You may be advised to keep your head raised (elevated) when sleeping. This reduces the chance of acid going backward from your stomach into your esophagus. Most of the time, nonspecific chest pain will improve within 2-3 days with rest and mild pain medicine.  HOME CARE INSTRUCTIONS   If antibiotics were prescribed, take them as directed. Finish them even if you start to feel better.  For the next few days, avoid physical activities that bring on chest pain. Continue physical activities as directed.  Do not use any tobacco products, including cigarettes, chewing tobacco, or electronic cigarettes.  Avoid drinking alcohol.  Only take medicine as directed by your health care provider.  Follow your health care provider's suggestions for further testing if your chest pain does not go away.  Keep any follow-up appointments you made. If you do not go to an appointment, you could develop lasting (chronic) problems with pain. If there is any problem keeping an appointment, call to reschedule. SEEK MEDICAL CARE IF:   Your chest pain does not go away, even after treatment.  You have a rash with blisters on your chest.  You have a fever. SEEK IMMEDIATE MEDICAL CARE IF:   You have increased chest pain or pain that spreads to your arm, neck, jaw, back, or abdomen.  You have shortness of breath.  You have an increasing cough, or you cough up blood.  You have severe back or abdominal pain.  You feel nauseous or vomit.  You have  severe weakness.  You faint.  You have chills. This is an emergency. Do not wait to see if the pain will go away. Get medical help at once. Call your local emergency services (911 in [place] Do not drive yourself to the hospital. MAKE SURE  YOU:   Understand these instructions.  Will watch your condition.  Will get help right away if you are not doing well or get worse. Document Released: 08/28/2005 Document Revised: 11/23/2013 Document Reviewed: 06/23/2008 [facility] Patient Information 2015 [place], [place]. This information is not intended to replace advice given to you by your health care provider. Make sure you discuss any questions you have with your health care provider.

## 2021-06-17 ENCOUNTER — Emergency Department (HOSPITAL_COMMUNITY): Payer: Self-pay

## 2021-06-17 ENCOUNTER — Other Ambulatory Visit: Payer: Self-pay

## 2021-06-17 ENCOUNTER — Emergency Department (HOSPITAL_COMMUNITY)
Admission: EM | Admit: 2021-06-17 | Discharge: 2021-06-17 | Disposition: A | Discharge status: Home / Self Care | Payer: Self-pay | Attending: Emergency Medicine | Admitting: Emergency Medicine

## 2021-06-17 DIAGNOSIS — R0602 Shortness of breath: Secondary | ICD-10-CM | POA: Insufficient documentation

## 2021-06-17 DIAGNOSIS — Z79899 Other long term (current) drug therapy: Secondary | ICD-10-CM | POA: Insufficient documentation

## 2021-06-17 DIAGNOSIS — R062 Wheezing: Secondary | ICD-10-CM | POA: Insufficient documentation

## 2021-06-17 DIAGNOSIS — J45909 Unspecified asthma, uncomplicated: Secondary | ICD-10-CM | POA: Insufficient documentation

## 2021-06-17 DIAGNOSIS — R059 Cough, unspecified: Secondary | ICD-10-CM | POA: Insufficient documentation

## 2021-06-17 DIAGNOSIS — F17210 Nicotine dependence, cigarettes, uncomplicated: Secondary | ICD-10-CM | POA: Insufficient documentation

## 2021-06-17 DIAGNOSIS — Z20822 Contact with and (suspected) exposure to covid-19: Secondary | ICD-10-CM | POA: Insufficient documentation

## 2021-06-17 LAB — CBC WITH DIFFERENTIAL/PLATELET
Abs Immature Granulocytes: 0.01 10*3/uL (ref 0.00–0.07)
Basophils Absolute: 0 10*3/uL (ref 0.0–0.1)
Basophils Relative: 1 %
Eosinophils Absolute: 0.1 10*3/uL (ref 0.0–0.5)
Eosinophils Relative: 1 %
HCT: 45 % (ref 39.0–52.0)
Hemoglobin: 14.7 g/dL (ref 13.0–17.0)
Immature Granulocytes: 0 %
Lymphocytes Relative: 48 %
Lymphs Abs: 1.9 10*3/uL (ref 0.7–4.0)
MCH: 30.5 pg (ref 26.0–34.0)
MCHC: 32.7 g/dL (ref 30.0–36.0)
MCV: 93.4 fL (ref 80.0–100.0)
Monocytes Absolute: 0.4 10*3/uL (ref 0.1–1.0)
Monocytes Relative: 9 %
Neutro Abs: 1.7 10*3/uL (ref 1.7–7.7)
Neutrophils Relative %: 41 %
Platelets: 239 10*3/uL (ref 150–400)
RBC: 4.82 MIL/uL (ref 4.22–5.81)
RDW: 12.8 % (ref 11.5–15.5)
WBC: 4 10*3/uL (ref 4.0–10.5)
nRBC: 0 % (ref 0.0–0.2)

## 2021-06-17 LAB — RESP PANEL BY RT-PCR (FLU A&B, COVID) ARPGX2
Influenza A by PCR: NEGATIVE
Influenza B by PCR: NEGATIVE
SARS Coronavirus 2 by RT PCR: NEGATIVE

## 2021-06-17 LAB — COMPREHENSIVE METABOLIC PANEL
ALT: 12 U/L (ref 0–44)
AST: 15 U/L (ref 15–41)
Albumin: 3.7 g/dL (ref 3.5–5.0)
Alkaline Phosphatase: 47 U/L (ref 38–126)
Anion gap: 6 (ref 5–15)
BUN: 8 mg/dL (ref 6–20)
CO2: 25 mmol/L (ref 22–32)
Calcium: 9.1 mg/dL (ref 8.9–10.3)
Chloride: 105 mmol/L (ref 98–111)
Creatinine, Ser: 1.04 mg/dL (ref 0.61–1.24)
GFR, Estimated: >60 mL/min (ref >60)
Glucose, Bld: 88 mg/dL (ref 70–99)
Potassium: 3.8 mmol/L (ref 3.5–5.1)
Sodium: 136 mmol/L (ref 135–145)
Total Bilirubin: 1.5 mg/dL — ABNORMAL HIGH (ref 0.3–1.2)
Total Protein: 7.6 g/dL (ref 6.5–8.1)

## 2021-06-17 LAB — ETHANOL: Alcohol, Ethyl (B): <10 mg/dL (ref <10)

## 2021-06-17 LAB — LIPASE, BLOOD: Lipase: 27 U/L (ref 11–51)

## 2021-06-17 LAB — TROPONIN I (HIGH SENSITIVITY): Troponin I (High Sensitivity): 4 ng/L (ref <18)

## 2021-06-17 MED ORDER — ALBUTEROL SULFATE HFA 108 (90 BASE) MCG/ACT IN AERS
2.0000 | INHALATION_SPRAY | Freq: Once | RESPIRATORY_TRACT | Status: AC
  Administered 2021-06-17: 2 via RESPIRATORY_TRACT
  Filled 2021-06-17: qty 6.7

## 2021-06-17 MED ORDER — PREDNISONE 20 MG PO TABS
60.0000 mg | ORAL_TABLET | ORAL | Status: AC
  Administered 2021-06-17: 60 mg via ORAL
  Filled 2021-06-17: qty 3

## 2021-06-17 MED ORDER — PREDNISONE 20 MG PO TABS: 40.0000 mg | ORAL_TABLET | Freq: Every day | ORAL | 0 refills | Status: DC

## 2021-06-17 NOTE — ED Provider Notes (Signed)
[facility] EMERGENCY DEPARTMENT Provider Note   CSN: [id number] Arrival date & time: 06/17/21  6599     History Chief Complaint  Patient presents with   Shortness of Breath    Thomas Houston is a 30 y.o. male.  HPI Patient with a history of childhood asthma presents with 1 day of dyspnea. Smokes marijuana daily, but none today. Onset was yesterday, and since that time patient has had persistent dyspnea without chest pain, without fever, chills, nausea, vomiting.  There is associated coughing, and some discomfort with that, but otherwise, as above, no discomfort. He has not required albuterol in years.  He offers a possible precipitant as cooking prior to the onset of dyspnea.    Past Medical History:  Diagnosis Date   Asthma     There are no problems to display for this patient.   No past surgical history on file.     Family History  Problem Relation Age of Onset   Asthma Mother    Hypertension Mother     Social History   Tobacco Use   Smoking status: Some Days    Packs/day: 3.00    Types: Cigarettes   Smokeless tobacco: Never  Substance Use Topics   Alcohol use: No   Drug use: No    Home Medications Prior to Admission medications   Medication Sig Start Date End Date Taking? Authorizing Provider  albuterol (PROVENTIL HFA;VENTOLIN HFA) 108 (90 BASE) MCG/ACT inhaler Inhale 2 puffs into the lungs every 4 (four) hours as needed for wheezing. 08/18/13   Reuben Likes, MD  albuterol (PROVENTIL) (2.5 MG/3ML) 0.083% nebulizer solution Take 3 mLs (2.5 mg total) by nebulization every 4 (four) hours as needed for wheezing or shortness of breath. 11/11/14   Pricilla Loveless, MD  amoxicillin (AMOXIL) 500 MG capsule Take 2 capsules (1,000 mg total) by mouth 2 (two) times daily. Patient not taking: Reported on 11/11/2014 11/12/12   Pisciotta, Joni Reining, PA-C  amoxicillin (AMOXIL) 500 MG capsule Take 1 capsule (500 mg total) by mouth 3 (three) times  daily. Patient not taking: Reported on 11/11/2014 08/18/13   Reuben Likes, MD  diphenhydrAMINE (BENADRYL) 25 mg capsule Take 25 mg by mouth every 6 (six) hours as needed for allergies.    [provider]  guaiFENesin-codeine (GUIATUSS AC) 100-10 MG/5ML syrup Take 10 mLs by mouth 4 (four) times daily as needed for cough. Patient not taking: Reported on 11/11/2014 08/18/13   Reuben Likes, MD  oxyCODONE-acetaminophen (PERCOCET) 5-325 MG per tablet 1 to 2 tablets every 6 hours as needed for pain. Patient not taking: Reported on 11/11/2014 08/18/13   Reuben Likes, MD  oxyCODONE-acetaminophen (PERCOCET/ROXICET) 5-325 MG per tablet 1 to 2 tabs PO q6hrs  PRN for pain Patient not taking: Reported on 11/11/2014 11/12/12   Pisciotta, Joni Reining, PA-C  predniSONE (DELTASONE) 10 MG tablet Take 6 tablets (60 mg total) by mouth daily. Patient not taking: Reported on 11/11/2014 10/06/14   Azalia Bilis, MD    Allergies    Other  Review of Systems   Review of Systems  Constitutional:        Per HPI, otherwise negative  HENT:         Per HPI, otherwise negative  Respiratory:         Per HPI, otherwise negative  Cardiovascular:        Per HPI, otherwise negative  Gastrointestinal:  Negative for vomiting.  Endocrine:  Negative aside from HPI  Genitourinary:        Neg aside from HPI   Musculoskeletal:        Per HPI, otherwise negative  Skin: Negative.   Neurological:  Negative for syncope.   Physical Exam Updated Vital Signs BP 133/82 (BP Location: Right Arm)   Pulse 75   Temp 97.8 F (36.6 C) (Oral)   Resp (!) 22   Ht 6\' 1"  (1.854 m)   Wt 86.2 kg   SpO2 96%   BMI 25.07 kg/m   Physical Exam Vitals and nursing note reviewed.  Constitutional:      General: He is not in acute distress.    Appearance: He is well-developed.  HENT:     Head: Normocephalic and atraumatic.  Eyes:     Conjunctiva/sclera: Conjunctivae normal.  Cardiovascular:     Rate and Rhythm: Normal  rate and regular rhythm.  Pulmonary:     Effort: Pulmonary effort is normal. No respiratory distress.     Breath sounds: No stridor. Examination of the left-upper field reveals wheezing. Wheezing present.  Abdominal:     General: There is no distension.  Skin:    General: Skin is warm and dry.     Comments: Plenty of tattoos  Neurological:     Mental Status: He is alert and oriented to person, place, and time.    ED Results / Procedures / Treatments   Labs (all labs ordered are listed, but only abnormal results are displayed) Labs Reviewed  COMPREHENSIVE METABOLIC PANEL - Abnormal; Notable for the following components:      Result Value   Total Bilirubin 1.5 (*)    All other components within normal limits  RESP PANEL BY RT-PCR (FLU A&B, COVID) ARPGX2  ETHANOL  LIPASE, BLOOD  CBC WITH DIFFERENTIAL/PLATELET  TROPONIN I (HIGH SENSITIVITY)  TROPONIN I (HIGH SENSITIVITY)    EKG EKG Interpretation  Date/Time:  Sunday June 17 2021 08:30:56 EDT Ventricular Rate:  60 PR Interval:  135 QRS Duration: 92 QT Interval:  386 QTC Calculation: 386 R Axis:   64 Text Interpretation: Sinus rhythm Anteroseptal infarct, old ST-t wave abnormality No significant change since last tracing Abnormal ECG Confirmed by [id number] [phone]) on 06/17/2021 8:45:29 AM  Radiology DG Chest Port 1 View  Result Date: 06/17/2021 CLINICAL DATA:  30 year old male with shortness of breath and chest pain. EXAM: PORTABLE CHEST 1 VIEW COMPARISON:  Chest radiographs 11/11/2014 and earlier. FINDINGS: Portable AP semi upright view at 0915 hours. Lung volumes and mediastinal contours remain normal. Visualized tracheal air column is within normal limits. Allowing for portable technique the lungs are clear. No pneumothorax or pleural effusion. No osseous abnormality identified. Negative visible bowel gas. IMPRESSION: Negative portable chest. Electronically Signed   By: 14/10/2014 M.D.   On: 06/17/2021 09:35     Procedures Procedures   Medications Ordered in ED Medications  predniSONE (DELTASONE) tablet 60 mg (has no administration in time range)  albuterol (VENTOLIN HFA) 108 (90 Base) MCG/ACT inhaler 2 puff (2 puffs Inhalation Given 06/17/21 0929)  Pulse ox 99% room air normal Cardiac 60s sinus normal   ED Course  I have reviewed the triage vital signs and the nursing notes.  Pertinent labs & imaging results that were available during my care of the patient were reviewed by me and considered in my medical decision making (see chart for details).   10:55 AM Patient improved, resting in right lateral decubitus position, no ongoing complaints.  We  discussed all findings and I reviewed his x-ray, EKG, labs.  Reassuring, no evidence for ACS, pneumonia, pneumothorax, suspicion for bronchitis versus bronchospasm given his wheezing, history of asthma.  Patient appropriate for ongoing albuterol therapy, steroids, discharged to follow-up with primary care. MDM Rules/Calculators/A&P MDM Number of Diagnoses or Management Options SOB (shortness of breath): new, needed workup   Amount and/or Complexity of Data Reviewed Clinical lab tests: ordered and reviewed Tests in the radiology section of CPT: reviewed Tests in the medicine section of CPT: reviewed and ordered Decide to obtain previous medical records or to obtain history from someone other than the patient: yes Review and summarize past medical records: yes Independent visualization of images, tracings, or specimens: yes  Risk of Complications, Morbidity, and/or Mortality Presenting problems: high Diagnostic procedures: high Management options: high  Critical Care Total time providing critical care: < 30 minutes  Patient Progress Patient progress: improved   Final Clinical Impression(s) / ED Diagnoses Final diagnoses:  SOB (shortness of breath)    Rx / DC Orders ED Discharge Orders          Ordered    predniSONE (DELTASONE)  20 MG tablet  Daily with breakfast        06/17/21 1057             Gerhard Munch, MD 06/17/21 1058

## 2021-06-17 NOTE — ED Triage Notes (Signed)
Pt arrives POV with c/o of SOB with onset last night. Coughing X1 day. Non-productive. Pt alert and oriented X4.

## 2021-06-17 NOTE — Discharge Instructions (Addendum)
As discussed, today's evaluation has been generally reassuring.  It is important that you obtain and use the prescribed steroids and use the provided albuterol every 4 hours for the next 2 days.  You may then use the albuterol as needed.  Return here for concerning changes in your condition

## 2021-07-13 ENCOUNTER — Emergency Department (HOSPITAL_COMMUNITY)
Admission: EM | Admit: 2021-07-13 | Discharge: 2021-07-13 | Disposition: A | Discharge status: Home / Self Care | Payer: Medicaid Other | Attending: Emergency Medicine | Admitting: Emergency Medicine

## 2021-07-13 ENCOUNTER — Encounter (HOSPITAL_COMMUNITY): Payer: Self-pay

## 2021-07-13 ENCOUNTER — Other Ambulatory Visit: Payer: Self-pay

## 2021-07-13 DIAGNOSIS — J4530 Mild persistent asthma, uncomplicated: Secondary | ICD-10-CM | POA: Insufficient documentation

## 2021-07-13 DIAGNOSIS — R0602 Shortness of breath: Secondary | ICD-10-CM | POA: Insufficient documentation

## 2021-07-13 DIAGNOSIS — Z760 Encounter for issue of repeat prescription: Secondary | ICD-10-CM | POA: Insufficient documentation

## 2021-07-13 DIAGNOSIS — F17210 Nicotine dependence, cigarettes, uncomplicated: Secondary | ICD-10-CM | POA: Insufficient documentation

## 2021-07-13 DIAGNOSIS — Z7951 Long term (current) use of inhaled steroids: Secondary | ICD-10-CM | POA: Insufficient documentation

## 2021-07-13 MED ORDER — ALBUTEROL SULFATE HFA 108 (90 BASE) MCG/ACT IN AERS: 2.0000 | INHALATION_SPRAY | RESPIRATORY_TRACT | 0 refills | Status: DC | PRN

## 2021-07-13 MED ORDER — ALBUTEROL SULFATE HFA 108 (90 BASE) MCG/ACT IN AERS
2.0000 | INHALATION_SPRAY | Freq: Once | RESPIRATORY_TRACT | Status: AC
  Administered 2021-07-13: 2 via RESPIRATORY_TRACT
  Filled 2021-07-13: qty 6.7

## 2021-07-13 NOTE — ED Triage Notes (Signed)
Pt to ED from home because he needs his prescription for his inhaler. Pt has a hx of asthma, no complaints at this time. Would also like a referral for a PCP. A+O, VSS< NADN.

## 2021-07-13 NOTE — ED Provider Notes (Signed)
[facility]-EMERGENCY DEPT Provider Note   CSN: [id number] Arrival date & time: 07/13/21  8338     History Chief Complaint  Patient presents with   Medication Refill    Thomas Houston is a 30 y.o. male.  The history is provided by the patient and medical records.  Medication Refill Thomas Houston is a 30 y.o. male who presents to the Emergency Department complaining of medication refill.  He has a hx/o asthma and uses an albuterol inhaler, two puffs twice daily.  He presents today due to running out of the medication.  He feels a little short of breath, but not a particular change from his baseline.    No fever, chest pain, cough.  Has wheeze.  No N/V/D, leg swelling/pain.  No hx/o blood clots.      Past Medical History:  Diagnosis Date   Asthma     There are no problems to display for this patient.   History reviewed. No pertinent surgical history.     Family History  Problem Relation Age of Onset   Asthma Mother    Hypertension Mother     Social History   Tobacco Use   Smoking status: Some Days    Packs/day: 3.00    Types: Cigarettes   Smokeless tobacco: Never  Substance Use Topics   Alcohol use: No   Drug use: No    Home Medications Prior to Admission medications   Medication Sig Start Date End Date Taking? Authorizing Provider  albuterol (PROVENTIL) (2.5 MG/3ML) 0.083% nebulizer solution Take 3 mLs (2.5 mg total) by nebulization every 4 (four) hours as needed for wheezing or shortness of breath. 11/11/14   Pricilla Loveless, MD  albuterol (VENTOLIN HFA) 108 (90 Base) MCG/ACT inhaler Inhale 2 puffs into the lungs every 4 (four) hours as needed for wheezing. 07/13/21   Tilden Fossa, MD  amoxicillin (AMOXIL) 500 MG capsule Take 2 capsules (1,000 mg total) by mouth 2 (two) times daily. Patient not taking: Reported on 11/11/2014 11/12/12   Pisciotta, Joni Reining, PA-C  amoxicillin (AMOXIL) 500 MG capsule Take 1 capsule (500 mg total) by mouth 3  (three) times daily. Patient not taking: Reported on 11/11/2014 08/18/13   Reuben Likes, MD  diphenhydrAMINE (BENADRYL) 25 mg capsule Take 25 mg by mouth every 6 (six) hours as needed for allergies.    [provider]  guaiFENesin-codeine (GUIATUSS AC) 100-10 MG/5ML syrup Take 10 mLs by mouth 4 (four) times daily as needed for cough. Patient not taking: Reported on 11/11/2014 08/18/13   Reuben Likes, MD  oxyCODONE-acetaminophen (PERCOCET) 5-325 MG per tablet 1 to 2 tablets every 6 hours as needed for pain. Patient not taking: Reported on 11/11/2014 08/18/13   Reuben Likes, MD  oxyCODONE-acetaminophen (PERCOCET/ROXICET) 5-325 MG per tablet 1 to 2 tabs PO q6hrs  PRN for pain Patient not taking: Reported on 11/11/2014 11/12/12   Pisciotta, Joni Reining, PA-C  predniSONE (DELTASONE) 10 MG tablet Take 6 tablets (60 mg total) by mouth daily. Patient not taking: Reported on 11/11/2014 10/06/14   Azalia Bilis, MD  predniSONE (DELTASONE) 20 MG tablet Take 2 tablets (40 mg total) by mouth daily with breakfast. For the next four days 06/17/21   Gerhard Munch, MD    Allergies    Other  Review of Systems   Review of Systems  All other systems reviewed and are negative.  Physical Exam Updated Vital Signs BP (!) 140/113 (BP Location: Left Arm)   Pulse Marland Kitchen)  55   Temp 97.8 F (36.6 C) (Oral)   Resp 16   SpO2 98%   Physical Exam Vitals and nursing note reviewed.  Constitutional:      Appearance: He is well-developed.  HENT:     Head: Normocephalic and atraumatic.  Cardiovascular:     Rate and Rhythm: Normal rate and regular rhythm.     Heart sounds: No murmur heard. Pulmonary:     Effort: Pulmonary effort is normal. No respiratory distress.     Breath sounds: Normal breath sounds.  Abdominal:     Palpations: Abdomen is soft.     Tenderness: There is no abdominal tenderness. There is no guarding or rebound.  Musculoskeletal:        General: No swelling or tenderness.  Skin:     General: Skin is warm and dry.  Neurological:     Mental Status: He is alert and oriented to person, place, and time.  Psychiatric:        Behavior: Behavior normal.    ED Results / Procedures / Treatments   Labs (all labs ordered are listed, but only abnormal results are displayed) Labs Reviewed - No data to display  EKG None  Radiology No results found.  Procedures Procedures   Medications Ordered in ED Medications  albuterol (VENTOLIN HFA) 108 (90 Base) MCG/ACT inhaler 2 puff (2 puffs Inhalation Given 07/13/21 0420)    ED Course  I have reviewed the triage vital signs and the nursing notes.  Pertinent labs & imaging results that were available during my care of the patient were reviewed by me and considered in my medical decision making (see chart for details).    MDM Rules/Calculators/A&P                          patient here requesting albuterol inhaler refill, has a history of asthma does not currently have a PCP. On evaluation he has no respiratory distress with good air movement bilaterally. Will prescribe refill but recommend establishing PCP for ongoing asthma surveillance and maintenance. Discussed return precautions.  Final Clinical Impression(s) / ED Diagnoses Final diagnoses:  Medication refill  Mild persistent asthma without complication    Rx / DC Orders ED Discharge Orders          Ordered    albuterol (VENTOLIN HFA) 108 (90 Base) MCG/ACT inhaler  Every 4 hours PRN        07/13/21 0410             Tilden Fossa, MD 07/13/21 [phone]

## 2021-08-15 ENCOUNTER — Emergency Department (HOSPITAL_COMMUNITY)
Admission: EM | Admit: 2021-08-15 | Discharge: 2021-08-15 | Disposition: A | Discharge status: Home / Self Care | Payer: Self-pay | Attending: Emergency Medicine | Admitting: Emergency Medicine

## 2021-08-15 ENCOUNTER — Other Ambulatory Visit: Payer: Self-pay

## 2021-08-15 ENCOUNTER — Emergency Department (HOSPITAL_COMMUNITY): Payer: Self-pay

## 2021-08-15 ENCOUNTER — Encounter (HOSPITAL_COMMUNITY): Payer: Self-pay

## 2021-08-15 DIAGNOSIS — Z20822 Contact with and (suspected) exposure to covid-19: Secondary | ICD-10-CM | POA: Insufficient documentation

## 2021-08-15 DIAGNOSIS — F17210 Nicotine dependence, cigarettes, uncomplicated: Secondary | ICD-10-CM | POA: Insufficient documentation

## 2021-08-15 DIAGNOSIS — J4531 Mild persistent asthma with (acute) exacerbation: Secondary | ICD-10-CM | POA: Insufficient documentation

## 2021-08-15 MED ORDER — IPRATROPIUM-ALBUTEROL 0.5-2.5 (3) MG/3ML IN SOLN: 3.0000 mL | RESPIRATORY_TRACT | 0 refills | Status: DC | PRN

## 2021-08-15 MED ORDER — PREDNISONE 50 MG PO TABS: 50.0000 mg | ORAL_TABLET | Freq: Every day | ORAL | 0 refills | Status: AC

## 2021-08-15 MED ORDER — ALBUTEROL SULFATE HFA 108 (90 BASE) MCG/ACT IN AERS: 1.0000 | INHALATION_SPRAY | Freq: Four times a day (QID) | RESPIRATORY_TRACT | 0 refills | Status: DC | PRN

## 2021-08-15 MED ORDER — ALBUTEROL SULFATE HFA 108 (90 BASE) MCG/ACT IN AERS
2.0000 | INHALATION_SPRAY | Freq: Once | RESPIRATORY_TRACT | Status: AC
  Administered 2021-08-15: 2 via RESPIRATORY_TRACT
  Filled 2021-08-15: qty 6.7

## 2021-08-15 MED ORDER — PREDNISONE 20 MG PO TABS
60.0000 mg | ORAL_TABLET | Freq: Once | ORAL | Status: AC
  Administered 2021-08-15: 60 mg via ORAL
  Filled 2021-08-15: qty 3

## 2021-08-15 NOTE — Discharge Instructions (Addendum)
Use the inhaler as needed  Follow-up with primary care provider

## 2021-08-15 NOTE — ED Triage Notes (Signed)
Pt complains of SHOB with cough and chest tightness x 4 hrs. Pt reports having asthma.

## 2021-08-15 NOTE — ED Provider Notes (Signed)
Emergency Medicine Provider Triage Evaluation Note  Thomas Houston , a 30 y.o. male  was evaluated in triage.  Pt complains of Sob. Hx of Asthma, States feels similar. Mild chest tightness like he cant take a deep breath. Has been out of albuterol inhaler x 1 week. Prior admissions as a child. No Fever, back pain, LE edema, hx of PE,DVT.  Review of Systems  Positive: wheeze Negative: Fever, emesis, back pain, LE edema  Physical Exam  BP (!) 150/80 (BP Location: Left Arm)   Pulse 68   Temp 98.5 F (36.9 C) (Oral)   Resp 17   SpO2 96%  Gen:   Awake, no distress   Resp:  Normal effort, mild expiratory wheeze MSK:   Moves extremities without difficulty  Other:    Medical Decision Making  Medically screening exam initiated at 10:37 PM.  Appropriate orders placed.  Thomas Houston was informed that the remainder of the evaluation will be completed by another provider, this initial triage assessment does not replace that evaluation, and the importance of remaining in the ED until their evaluation is complete.  SOB, hx of asthma  Order for albuterol in triage given   Thomas Dibbles, PA-C 08/15/21 2239    Thomas Mulders, MD 08/24/21 [phone]

## 2021-08-15 NOTE — ED Provider Notes (Signed)
[facility]-EMERGENCY DEPT Provider Note   CSN: [id number] Arrival date & time: 08/15/21  2224     History Chief Complaint  Patient presents with   Shortness of Breath   Cough    Thomas Houston is a 30 y.o. male with past medical history significant for asthma who presents for evaluation of Cough and SOB. Sob began 4 hours PTA. Feels like he cannot take a deep breath. Has been out of his home albuterol inhaler. Last use 1 week ago. Typically uses daily. No fever, chills, emesis, back pain, pleuritic/ exertional CP, abd pain, LE edema. No hx of PE, DVT. No recent surgery, immobilization, malignancy. Previous asthma admission as a child. No recently. No recent COVID exposures. Denies additional aggravating or alleviating factors.   History obtained from patient and past medical records. No interpretor was used.  HPI     Past Medical History:  Diagnosis Date   Asthma     There are no problems to display for this patient.   History reviewed. No pertinent surgical history.     Family History  Problem Relation Age of Onset   Asthma Mother    Hypertension Mother     Social History   Tobacco Use   Smoking status: Some Days    Packs/day: 3.00    Types: Cigarettes   Smokeless tobacco: Never  Substance Use Topics   Alcohol use: No   Drug use: No    Home Medications Prior to Admission medications   Medication Sig Start Date End Date Taking? Authorizing Provider  albuterol (VENTOLIN HFA) 108 (90 Base) MCG/ACT inhaler Inhale 2 puffs into the lungs every 4 (four) hours as needed for wheezing. 07/13/21  Yes Tilden Fossa, MD  albuterol (VENTOLIN HFA) 108 (90 Base) MCG/ACT inhaler Inhale 1-2 puffs into the lungs every 6 (six) hours as needed for wheezing or shortness of breath. 08/15/21  Yes Elyn Krogh A, PA-C  ipratropium-albuterol (DUONEB) 0.5-2.5 (3) MG/3ML SOLN Take 3 mLs by nebulization every 4 (four) hours as needed. 08/15/21  Yes Aalia Greulich,  Sheriece Jefcoat A, PA-C  predniSONE (DELTASONE) 50 MG tablet Take 1 tablet (50 mg total) by mouth daily for 4 days. 08/15/21 08/19/21 Yes Kadin Bera A, PA-C  albuterol (PROVENTIL) (2.5 MG/3ML) 0.083% nebulizer solution Take 3 mLs (2.5 mg total) by nebulization every 4 (four) hours as needed for wheezing or shortness of breath. 11/11/14   Pricilla Loveless, MD    Allergies    Other  Review of Systems   Review of Systems  Constitutional: Negative.   HENT: Negative.    Respiratory:  Positive for cough, chest tightness, shortness of breath and wheezing. Negative for apnea, choking and stridor.   Cardiovascular: Negative.   Gastrointestinal: Negative.   Genitourinary: Negative.   Musculoskeletal: Negative.   Skin: Negative.   Neurological: Negative.   All other systems reviewed and are negative.  Physical Exam Updated Vital Signs BP (!) 150/80 (BP Location: Left Arm)   Pulse 68   Temp 98.5 F (36.9 C) (Oral)   Resp 17   SpO2 96%   Physical Exam Vitals and nursing note reviewed.  Constitutional:      General: He is not in acute distress.    Appearance: He is well-developed. He is not ill-appearing, toxic-appearing or diaphoretic.  HENT:     Head: Normocephalic and atraumatic.  Eyes:     Pupils: Pupils are equal, round, and reactive to light.  Cardiovascular:     Rate and Rhythm: Normal  rate and regular rhythm.     Pulses: Normal pulses.          Radial pulses are 2+ on the right side and 2+ on the left side.       Dorsalis pedis pulses are 2+ on the right side and 2+ on the left side.     Heart sounds: Normal heart sounds.  Pulmonary:     Effort: Pulmonary effort is normal. No respiratory distress.     Breath sounds: Wheezing present.  Chest:     Comments: Equal rise and fall to chest wall Abdominal:     General: Bowel sounds are normal. There is no distension.     Palpations: Abdomen is soft.  Musculoskeletal:        General: Normal range of motion.     Cervical back:  Normal range of motion and neck supple.     Right lower leg: No tenderness. No edema.     Left lower leg: No tenderness. No edema.     Comments: Compartments soft. No bony tenderness  Skin:    General: Skin is warm and dry.     Capillary Refill: Capillary refill takes less than 2 seconds.  Neurological:     General: No focal deficit present.     Mental Status: He is alert and oriented to person, place, and time.    ED Results / Procedures / Treatments   Labs (all labs ordered are listed, but only abnormal results are displayed) Labs Reviewed  SARS CORONAVIRUS 2 (TAT 6-24 HRS)    EKG EKG Interpretation  Date/Time:  Wednesday August 15 2021 23:04:08 EDT Ventricular Rate:  66 PR Interval:  137 QRS Duration: 101 QT Interval:  385 QTC Calculation: 404 R Axis:     Text Interpretation: [facility] Derived Leads Confirmed by Vanetta Mulders [phone]) on 08/15/2021 11:14:43 PM  Radiology DG Chest Portable 1 View  Result Date: 08/15/2021 CLINICAL DATA:  Shortness of breath. EXAM: PORTABLE CHEST 1 VIEW COMPARISON:  June 17, 2021 FINDINGS: The heart size and mediastinal contours are within normal limits. Both lungs are clear. The visualized skeletal structures are unremarkable. IMPRESSION: No active disease. Electronically Signed   By: Aram Candela M.D.   On: 08/15/2021 23:03    Procedures Procedures   Medications Ordered in ED Medications  predniSONE (DELTASONE) tablet 60 mg (has no administration in time range)  albuterol (VENTOLIN HFA) 108 (90 Base) MCG/ACT inhaler 2 puff (2 puffs Inhalation Given 08/15/21 2244)   ED Course  I have reviewed the triage vital signs and the nursing notes.  Pertinent labs & imaging results that were available during my care of the patient were reviewed by me and considered in my medical decision making (see chart for details).  Here for evaluation of cough and SOB. Afebrile, non septic non ill appearing. Feels like prior asthma exacerbation, out of  home albuterol. No clinical evidence of VTE on exam. Perc neg, wells criteria low risk. No exertional SOB, DOE to suggest atypical ACS. Mild wheeze on exam. NV intact. Heart clear. Abd soft.  Chest xray without acute abnormality EKG with   Patient reassessed. After albuterol. Lungs improved. No hypoxia with ambulation. Sx likely due to asthma exacerbation. Will treat as such.  Will dc home with albuterol, FU on COVID results  The patient has been appropriately medically screened and/or stabilized in the ED. I have low suspicion for any other emergent medical condition which would require further screening, evaluation or treatment in the ED  or require inpatient management.  Patient is hemodynamically stable and in no acute distress.  Patient able to ambulate in department prior to ED.  Evaluation does not show acute pathology that would require ongoing or additional emergent interventions while in the emergency department or further inpatient treatment.  I have discussed the diagnosis with the patient and answered all questions.  Pain is been managed while in the emergency department and patient has no further complaints prior to discharge.  Patient is comfortable with plan discussed in room and is stable for discharge at this time.  I have discussed strict return precautions for returning to the emergency department.  Patient was encouraged to follow-up with PCP/specialist refer to at discharge.     MDM Rules/Calculators/A&P                           Thomas Houston was evaluated in Emergency Department on 08/15/2021 for the symptoms described in the history of present illness. He was evaluated in the context of the global COVID-19 pandemic, which necessitated consideration that the patient might be at risk for infection with the SARS-CoV-2 virus that causes COVID-19. Institutional protocols and algorithms that pertain to the evaluation of patients at risk for COVID-19 are in a state of rapid change  based on information released by regulatory bodies including the CDC and federal and state organizations. These policies and algorithms were followed during the patient's care in the ED.  Final Clinical Impression(s) / ED Diagnoses Final diagnoses:  Mild persistent asthma with exacerbation    Rx / DC Orders ED Discharge Orders          Ordered    For home use only DME Nebulizer machine        08/15/21 2309    ipratropium-albuterol (DUONEB) 0.5-2.5 (3) MG/3ML SOLN  Every 4 hours PRN        08/15/21 2309    albuterol (VENTOLIN HFA) 108 (90 Base) MCG/ACT inhaler  Every 6 hours PRN        08/15/21 2309    predniSONE (DELTASONE) 50 MG tablet  Daily        08/15/21 2314             Isao Seltzer A, PA-C 08/15/21 2316    Vanetta Mulders, MD 08/24/21 [phone]

## 2021-08-16 LAB — SARS CORONAVIRUS 2 (TAT 6-24 HRS): SARS Coronavirus 2: NEGATIVE

## 2021-08-23 ENCOUNTER — Emergency Department (HOSPITAL_COMMUNITY)
Admission: EM | Admit: 2021-08-23 | Discharge: 2021-08-23 | Discharge status: Against Medical Advice | Payer: No Typology Code available for payment source

## 2021-08-23 NOTE — ED Notes (Signed)
Pt stated he was leaving before triage

## 2021-08-24 ENCOUNTER — Encounter (HOSPITAL_COMMUNITY): Payer: Self-pay | Admitting: Emergency Medicine

## 2021-08-24 ENCOUNTER — Other Ambulatory Visit: Payer: Self-pay

## 2021-08-24 ENCOUNTER — Ambulatory Visit (HOSPITAL_COMMUNITY)
Admission: EM | Admit: 2021-08-24 | Discharge: 2021-08-24 | Disposition: A | Discharge status: Home / Self Care | Payer: Self-pay | Attending: Internal Medicine | Admitting: Internal Medicine

## 2021-08-24 DIAGNOSIS — M7918 Myalgia, other site: Secondary | ICD-10-CM

## 2021-08-24 MED ORDER — CYCLOBENZAPRINE HCL 10 MG PO TABS: 10.0000 mg | ORAL_TABLET | Freq: Three times a day (TID) | ORAL | 0 refills | Status: DC | PRN

## 2021-08-24 MED ORDER — IBUPROFEN 800 MG PO TABS
800.0000 mg | ORAL_TABLET | Freq: Three times a day (TID) | ORAL | 0 refills | Status: DC | PRN
Start: 2021-08-24 — End: 2021-11-21
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
## 2021-08-24 NOTE — Discharge Instructions (Addendum)
Heating pad and/or warm soaks as we discussed.  Use naproxen as prescribed no longer than 5 days.  I have also prescribed you a muscle relaxer.  As we discussed this medication can make you quite tired.  No driving after taking medication.  Please return for any worsening or persistent symptoms.

## 2021-09-29 ENCOUNTER — Encounter (HOSPITAL_COMMUNITY): Payer: Self-pay | Admitting: Emergency Medicine

## 2021-09-29 ENCOUNTER — Emergency Department (HOSPITAL_COMMUNITY)
Admission: EM | Admit: 2021-09-29 | Discharge: 2021-09-29 | Disposition: A | Discharge status: Home / Self Care | Payer: Self-pay | Attending: Emergency Medicine | Admitting: Emergency Medicine

## 2021-09-29 ENCOUNTER — Other Ambulatory Visit: Payer: Self-pay

## 2021-09-29 ENCOUNTER — Emergency Department (HOSPITAL_COMMUNITY): Payer: Self-pay

## 2021-09-29 DIAGNOSIS — R062 Wheezing: Secondary | ICD-10-CM | POA: Insufficient documentation

## 2021-09-29 DIAGNOSIS — J45909 Unspecified asthma, uncomplicated: Secondary | ICD-10-CM | POA: Insufficient documentation

## 2021-09-29 DIAGNOSIS — R059 Cough, unspecified: Secondary | ICD-10-CM | POA: Insufficient documentation

## 2021-09-29 DIAGNOSIS — R0602 Shortness of breath: Secondary | ICD-10-CM | POA: Insufficient documentation

## 2021-09-29 DIAGNOSIS — Z5321 Procedure and treatment not carried out due to patient leaving prior to being seen by health care provider: Secondary | ICD-10-CM | POA: Insufficient documentation

## 2021-09-29 MED ORDER — ALBUTEROL SULFATE (2.5 MG/3ML) 0.083% IN NEBU
5.0000 mg | INHALATION_SOLUTION | Freq: Once | RESPIRATORY_TRACT | Status: AC
  Administered 2021-09-29: 5 mg via RESPIRATORY_TRACT
  Filled 2021-09-29: qty 6

## 2021-09-29 NOTE — ED Notes (Signed)
Pt was seen leaving earlier and has not returned. Registration has called on pt with no response X2 and pt did not respond when called for vitals check

## 2021-09-29 NOTE — ED Triage Notes (Signed)
Patient reports asthma attack this evening with wheezing/SOB and dry cough .

## 2021-10-15 ENCOUNTER — Encounter (HOSPITAL_COMMUNITY): Payer: Self-pay

## 2021-10-15 ENCOUNTER — Emergency Department (HOSPITAL_COMMUNITY)
Admission: EM | Admit: 2021-10-15 | Discharge: 2021-10-16 | Disposition: A | Discharge status: Home / Self Care | Payer: Medicaid Other | Attending: Emergency Medicine | Admitting: Emergency Medicine

## 2021-10-15 ENCOUNTER — Other Ambulatory Visit: Payer: Self-pay

## 2021-10-15 DIAGNOSIS — J45909 Unspecified asthma, uncomplicated: Secondary | ICD-10-CM | POA: Insufficient documentation

## 2021-10-15 DIAGNOSIS — F17210 Nicotine dependence, cigarettes, uncomplicated: Secondary | ICD-10-CM | POA: Insufficient documentation

## 2021-10-15 DIAGNOSIS — Z760 Encounter for issue of repeat prescription: Secondary | ICD-10-CM | POA: Insufficient documentation

## 2021-10-15 DIAGNOSIS — R0602 Shortness of breath: Secondary | ICD-10-CM | POA: Insufficient documentation

## 2021-10-15 MED ORDER — ALBUTEROL SULFATE (2.5 MG/3ML) 0.083% IN NEBU: 2.5000 mg | INHALATION_SOLUTION | RESPIRATORY_TRACT | 3 refills | Status: DC | PRN

## 2021-10-15 MED ORDER — ALBUTEROL SULFATE HFA 108 (90 BASE) MCG/ACT IN AERS
2.0000 | INHALATION_SPRAY | Freq: Once | RESPIRATORY_TRACT | Status: AC
  Administered 2021-10-16: 2 via RESPIRATORY_TRACT
  Filled 2021-10-15: qty 6.7

## 2021-10-15 MED ORDER — DEXAMETHASONE 4 MG PO TABS
10.0000 mg | ORAL_TABLET | Freq: Once | ORAL | Status: AC
  Administered 2021-10-16: 10 mg via ORAL
  Filled 2021-10-15: qty 2

## 2021-10-15 MED ORDER — ALBUTEROL SULFATE HFA 108 (90 BASE) MCG/ACT IN AERS: 2.0000 | INHALATION_SPRAY | RESPIRATORY_TRACT | 0 refills | Status: DC | PRN

## 2021-10-15 NOTE — Discharge Instructions (Addendum)
Use your albuterol inhaler or nebulizer every 4 hours as needed for wheezing, chest tightness, shortness of breath.  Follow-up with a primary doctor.

## 2021-10-15 NOTE — ED Triage Notes (Signed)
Pt states that he needs his inhaler refilled because he ran out 3 days ago and the weather is changing.

## 2021-10-16 NOTE — ED Provider Notes (Signed)
[facility] Provider Note   CSN: [id number] Arrival date & time: 10/15/21  2315     History Chief Complaint  Patient presents with   Medication Refill    Thomas Houston is a 30 y.o. male.  30 year old male presents to the emergency department requesting an albuterol inhaler.  He reports that he ran out of his inhaler 3 days ago.  Has had intermittent episodes of shortness of breath related to his known history of asthma.  Typically notices issues with changes in the weather.  Has not had any fevers and denies sick contacts.  Presently states he is asymptomatic.  The history is provided by the patient. No language interpreter was used.  Medication Refill     Past Medical History:  Diagnosis Date   Asthma     There are no problems to display for this patient.   History reviewed. No pertinent surgical history.     Family History  Problem Relation Age of Onset   Asthma Mother    Hypertension Mother     Social History   Tobacco Use   Smoking status: Some Days    Packs/day: 3.00    Types: Cigarettes   Smokeless tobacco: Never  Substance Use Topics   Alcohol use: No   Drug use: No    Home Medications Prior to Admission medications   Medication Sig Start Date End Date Taking? Authorizing Provider  albuterol (PROVENTIL) (2.5 MG/3ML) 0.083% nebulizer solution Take 3 mLs (2.5 mg total) by nebulization every 4 (four) hours as needed for wheezing or shortness of breath. 10/15/21   Antony Madura, PA-C  albuterol (VENTOLIN HFA) 108 (90 Base) MCG/ACT inhaler Inhale 2 puffs into the lungs every 4 (four) hours as needed for wheezing. 10/15/21   Antony Madura, PA-C  cyclobenzaprine (FLEXERIL) 10 MG tablet Take 1 tablet (10 mg total) by mouth 3 (three) times daily as needed for muscle spasms. 08/24/21   Rolla Etienne, NP  ibuprofen (ADVIL) 800 MG tablet Take 1 tablet (800 mg total) by mouth every 8 (eight) hours as needed. 08/24/21   Rolla Etienne, NP  ipratropium-albuterol (DUONEB) 0.5-2.5 (3) MG/3ML SOLN Take 3 mLs by nebulization every 4 (four) hours as needed. 08/15/21   Henderly, Britni A, PA-C    Allergies    Other  Review of Systems   Review of Systems Ten systems reviewed and are negative for acute change, except as noted in the HPI.    Physical Exam Updated Vital Signs BP (!) 158/102 (BP Location: Right Arm)   Pulse 60   Temp 97.8 F (36.6 C) (Oral)   Resp 16   SpO2 96%   Physical Exam Vitals and nursing note reviewed.  Constitutional:      General: He is not in acute distress.    Appearance: He is well-developed. He is not diaphoretic.     Comments: Nontoxic appearing and in NAD  HENT:     Head: Normocephalic and atraumatic.  Eyes:     General: No scleral icterus.    Conjunctiva/sclera: Conjunctivae normal.  Cardiovascular:     Rate and Rhythm: Normal rate and regular rhythm.     Pulses: Normal pulses.  Pulmonary:     Effort: Pulmonary effort is normal. No respiratory distress.     Breath sounds: No wheezing, rhonchi or rales.     Comments: Respirations even and unlabored Musculoskeletal:        General: Normal range of motion.  Cervical back: Normal range of motion.  Skin:    General: Skin is warm and dry.     Coloration: Skin is not pale.     Findings: No erythema or rash.  Neurological:     Mental Status: He is alert and oriented to person, place, and time.     Coordination: Coordination normal.  Psychiatric:        Behavior: Behavior normal.    ED Results / Procedures / Treatments   Labs (all labs ordered are listed, but only abnormal results are displayed) Labs Reviewed - No data to display  EKG None  Radiology No results found.  Procedures Procedures   Medications Ordered in ED Medications  albuterol (VENTOLIN HFA) 108 (90 Base) MCG/ACT inhaler 2 puff (has no administration in time range)  dexamethasone (DECADRON) tablet 10 mg (has no administration in time range)     ED Course  I have reviewed the triage vital signs and the nursing notes.  Pertinent labs & imaging results that were available during my care of the patient were reviewed by me and considered in my medical decision making (see chart for details).    MDM Rules/Calculators/A&P                           30 year old male presents to the emergency department requesting a refill of his albuterol inhaler.  Presently has clear lung sounds without hypoxia.  Also provided prescription for albuterol nebulizer solution.  Was given a tablet of Decadron prior to discharge given reported history of acute bronchospasm secondary to seasonal change.  Encouraged follow-up with a primary care doctor.  Discharged in stable condition.   Final Clinical Impression(s) / ED Diagnoses Final diagnoses:  Encounter for medication refill    Rx / DC Orders ED Discharge Orders          Ordered    albuterol (VENTOLIN HFA) 108 (90 Base) MCG/ACT inhaler  Every 4 hours PRN        10/15/21 2351    albuterol (PROVENTIL) (2.5 MG/3ML) 0.083% nebulizer solution  Every 4 hours PRN        10/15/21 2351             Antony Madura, PA-C 10/16/21 0004    Dione Booze, MD 10/16/21 [phone]

## 2021-11-21 ENCOUNTER — Ambulatory Visit (HOSPITAL_COMMUNITY)
Admission: EM | Admit: 2021-11-21 | Discharge: 2021-11-21 | Disposition: A | Discharge status: Home / Self Care | Payer: Medicaid Other | Attending: Physician Assistant | Admitting: Physician Assistant

## 2021-11-21 ENCOUNTER — Other Ambulatory Visit: Payer: Self-pay

## 2021-11-21 ENCOUNTER — Encounter (HOSPITAL_COMMUNITY): Payer: Self-pay | Admitting: Emergency Medicine

## 2021-11-21 DIAGNOSIS — J4521 Mild intermittent asthma with (acute) exacerbation: Secondary | ICD-10-CM

## 2021-11-21 DIAGNOSIS — R062 Wheezing: Secondary | ICD-10-CM

## 2021-11-21 DIAGNOSIS — R051 Acute cough: Secondary | ICD-10-CM

## 2021-11-21 MED ORDER — ALBUTEROL SULFATE HFA 108 (90 BASE) MCG/ACT IN AERS: 2.0000 | INHALATION_SPRAY | RESPIRATORY_TRACT | 0 refills | Status: DC | PRN

## 2021-11-21 MED ORDER — ALBUTEROL SULFATE (2.5 MG/3ML) 0.083% IN NEBU: 2.5000 mg | INHALATION_SOLUTION | RESPIRATORY_TRACT | 0 refills | Status: DC | PRN

## 2021-11-21 MED ORDER — ALBUTEROL SULFATE HFA 108 (90 BASE) MCG/ACT IN AERS
INHALATION_SPRAY | RESPIRATORY_TRACT | Status: AC
  Filled 2021-11-21: qty 6.7

## 2021-11-21 MED ORDER — METHYLPREDNISOLONE SODIUM SUCC 125 MG IJ SOLR
INTRAMUSCULAR | Status: AC
  Filled 2021-11-21: qty 2

## 2021-11-21 MED ORDER — ALBUTEROL SULFATE HFA 108 (90 BASE) MCG/ACT IN AERS
2.0000 | INHALATION_SPRAY | Freq: Once | RESPIRATORY_TRACT | Status: AC
  Administered 2021-11-21: 13:00:00 2 via RESPIRATORY_TRACT

## 2021-11-21 MED ORDER — PREDNISONE 10 MG (21) PO TBPK: ORAL_TABLET | ORAL | 0 refills | Status: DC

## 2021-11-21 MED ORDER — METHYLPREDNISOLONE SODIUM SUCC 125 MG IJ SOLR
60.0000 mg | Freq: Once | INTRAMUSCULAR | Status: AC
  Administered 2021-11-21: 13:00:00 60 mg via INTRAMUSCULAR
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
## 2021-11-21 NOTE — ED Triage Notes (Signed)
Patient c/o wheezing and productive cough x 1 week.   Patient present for medication refill of Albuterol inhaler.   Patient endorses SOB upon exertion.   Patient has taken Nyquil with some relief of symptoms.   Patient used nebulizer with some relief of symptoms

## 2021-11-21 NOTE — Discharge Instructions (Addendum)
I have called in a refill of your albuterol as we discussed.  Start prednisone taper tomorrow.  Do not take NSAIDs including aspirin, ibuprofen/Advil, naproxen/Aleve with this medication as it can cause stomach bleeding.  You can use Tylenol as needed.  As we discussed, you may benefit from a maintenance medication, however, these can be very expensive.  We will try to establish you with your primary care so they can help arrange low-cost medication.  If you develop any worsening symptoms including shortness of breath, chest pain, chest tightness, severe cough you need to be seen immediately.

## 2022-03-14 ENCOUNTER — Ambulatory Visit (HOSPITAL_COMMUNITY)
Admission: EM | Admit: 2022-03-14 | Discharge: 2022-03-14 | Disposition: A | Discharge status: Home / Self Care | Payer: Self-pay | Attending: Physician Assistant | Admitting: Physician Assistant

## 2022-03-14 ENCOUNTER — Encounter (HOSPITAL_COMMUNITY): Payer: Self-pay | Admitting: Emergency Medicine

## 2022-03-14 DIAGNOSIS — J4541 Moderate persistent asthma with (acute) exacerbation: Secondary | ICD-10-CM

## 2022-03-14 MED ORDER — PREDNISONE 10 MG (21) PO TBPK: ORAL_TABLET | ORAL | 0 refills | Status: DC

## 2022-03-14 MED ORDER — METHYLPREDNISOLONE SODIUM SUCC 125 MG IJ SOLR
80.0000 mg | Freq: Once | INTRAMUSCULAR | Status: AC
  Administered 2022-03-14: 80 mg via INTRAMUSCULAR

## 2022-03-14 MED ORDER — ALBUTEROL SULFATE HFA 108 (90 BASE) MCG/ACT IN AERS
INHALATION_SPRAY | RESPIRATORY_TRACT | Status: AC
  Filled 2022-03-14: qty 6.7

## 2022-03-14 MED ORDER — METHYLPREDNISOLONE SODIUM SUCC 125 MG IJ SOLR
INTRAMUSCULAR | Status: AC
  Filled 2022-03-14: qty 2

## 2022-03-14 MED ORDER — ALBUTEROL SULFATE HFA 108 (90 BASE) MCG/ACT IN AERS: 2.0000 | INHALATION_SPRAY | RESPIRATORY_TRACT | 1 refills | Status: DC | PRN

## 2022-03-14 MED ORDER — ALBUTEROL SULFATE HFA 108 (90 BASE) MCG/ACT IN AERS
2.0000 | INHALATION_SPRAY | Freq: Once | RESPIRATORY_TRACT | Status: AC
  Administered 2022-03-14: 2 via RESPIRATORY_TRACT

## 2022-03-14 NOTE — Discharge Instructions (Signed)
I believe that you are having an asthma flare.  Please continue using your albuterol inhaler as needed.  We gave you an injection of steroids today.  Please start prednisone taper tomorrow (03/15/2022).  Do not take NSAIDs with this medication including aspirin, ibuprofen/Advil, naproxen/Aleve as it can cause stomach bleeding.  As we discussed, you would likely benefit from a maintenance medication once you have your insurance figured out please return so we can consider starting this.  If at any point anything worsens and you have shortness of breath/wheezing is not responding to medication, worsening cough, lightheadedness you need to go to the emergency room immediately. ?

## 2022-03-14 NOTE — ED Provider Notes (Signed)
?[facility] ? ? ? ?CSN: [id number] ?Arrival date & time: 03/14/22  0802 ? ? ?  ? ?History   ?Chief Complaint ?Chief Complaint  ?Patient presents with  ? Shortness of Breath  ? Wheezing  ? ? ?HPI ?Thomas Houston is a 31 y.o. male.  ? ?Patient presents today with a several day history of worsening asthma symptoms.  He has a history of asthma and has been hospitalized in the past but has not required intubation.  He reports depending on his albuterol inhaler multiple times a day.  He is often triggered by his work environment as he works in [employer] is exposed to smoke.  He has not had a maintenance medication in the past.  He is currently without insurance and so is not interested in this given potential expense.  He is requesting a refill of his albuterol.  He does report nocturnal symptoms.  Reports ongoing chest tightness, shortness of breath, cough.  Denies any fever, nausea, vomiting, congestion, sore throat.  He does occasionally smoke cigarettes. ? ? ?Past Medical History:  ?Diagnosis Date  ? Asthma   ? ? ?There are no problems to display for this patient. ? ? ?History reviewed. No pertinent surgical history. ? ? ? ? ?Home Medications   ? ?Prior to Admission medications   ?Medication Sig Start Date End Date Taking? Authorizing Provider  ?albuterol (PROVENTIL) (2.5 MG/3ML) 0.083% nebulizer solution Take 3 mLs (2.5 mg total) by nebulization every 4 (four) hours as needed for wheezing or shortness of breath. 11/21/21   Bianca Raneri, Noberto Retort, PA-C  ?albuterol (VENTOLIN HFA) 108 (90 Base) MCG/ACT inhaler Inhale 2 puffs into the lungs every 4 (four) hours as needed for wheezing. 03/14/22   Nivea Wojdyla, Noberto Retort, PA-C  ?predniSONE (STERAPRED UNI-PAK 21 TAB) 10 MG (21) TBPK tablet As directed 03/14/22   Allysson Rinehimer, Noberto Retort, PA-C  ? ? ?Family History ?Family History  ?Problem Relation Age of Onset  ? Asthma Mother   ? Hypertension Mother   ? ? ?Social History ?Social History  ? ?Tobacco Use  ? Smoking status: Some Days  ?   Packs/day: 3.00  ?  Types: Cigarettes  ? Smokeless tobacco: Never  ?Substance Use Topics  ? Alcohol use: No  ? Drug use: No  ? ? ? ?Allergies   ?Other ? ? ?Review of Systems ?Review of Systems  ?Constitutional:  Positive for activity change. Negative for appetite change, fatigue and fever.  ?HENT:  Negative for congestion, sinus pressure, sneezing and sore throat.   ?Respiratory:  Positive for cough, chest tightness, shortness of breath and wheezing.   ?Cardiovascular:  Negative for chest pain.  ?Gastrointestinal:  Negative for abdominal pain, diarrhea, nausea and vomiting.  ?Neurological:  Negative for dizziness, light-headedness and headaches.  ? ? ?Physical Exam ?Triage Vital Signs ?ED Triage Vitals [03/14/22 0821]  ?Enc Vitals Group  ?   BP (!) 154/64  ?   Pulse Rate 61  ?   Resp 19  ?   Temp (!) 97.4 ?F (36.3 ?C)  ?   Temp src   ?   SpO2 95 %  ?   Weight   ?   Height   ?   Head Circumference   ?   Peak Flow   ?   Pain Score 0  ?   Pain Loc   ?   Pain Edu?   ?   Excl. in GC?   ? ?No data found. ? ?Updated Vital  Signs ?BP (!) 154/64   Pulse 61   Temp (!) 97.4 ?F (36.3 ?C)   Resp 19   SpO2 95%  ? ?Visual Acuity ?Right Eye Distance:   ?Left Eye Distance:   ?Bilateral Distance:   ? ?Right Eye Near:   ?Left Eye Near:    ?Bilateral Near:    ? ?Physical Exam ?Vitals reviewed.  ?Constitutional:   ?   General: He is awake.  ?   Appearance: Normal appearance. He is well-developed. He is not ill-appearing.  ?   Comments: Very pleasant male appears stated age in no acute distress sitting comfortably in exam room  ?HENT:  ?   Head: Normocephalic and atraumatic.  ?   Right Ear: Tympanic membrane, ear canal and external ear normal. Tympanic membrane is not erythematous or bulging.  ?   Left Ear: Tympanic membrane, ear canal and external ear normal. Tympanic membrane is not erythematous or bulging.  ?   Nose: Nose normal.  ?   Mouth/Throat:  ?   Pharynx: Uvula midline. No oropharyngeal exudate, posterior oropharyngeal  erythema or uvula swelling.  ?Cardiovascular:  ?   Rate and Rhythm: Normal rate and regular rhythm.  ?   Heart sounds: Normal heart sounds, S1 normal and S2 normal. No murmur heard. ?Pulmonary:  ?   Effort: Pulmonary effort is normal. No accessory muscle usage or respiratory distress.  ?   Breath sounds: No stridor. Wheezing present. No rhonchi or rales.  ?   Comments: Scattered wheezing ?Abdominal:  ?   General: Bowel sounds are normal.  ?   Palpations: Abdomen is soft.  ?   Tenderness: There is no abdominal tenderness.  ?Neurological:  ?   Mental Status: He is alert.  ?Psychiatric:     ?   Behavior: Behavior is cooperative.  ? ? ? ?UC Treatments / Results  ?Labs ?(all labs ordered are listed, but only abnormal results are displayed) ?Labs Reviewed - No data to display ? ?EKG ? ? ?Radiology ?No results found. ? ?Procedures ?Procedures (including critical care time) ? ?Medications Ordered in UC ?Medications  ?methylPREDNISolone sodium succinate (SOLU-MEDROL) 125 mg/2 mL injection 80 mg (80 mg Intramuscular Given 03/14/22 0846)  ?albuterol (VENTOLIN HFA) 108 (90 Base) MCG/ACT inhaler 2 puff (2 puffs Inhalation Given 03/14/22 0846)  ? ? ?Initial Impression / Assessment and Plan / UC Course  ?I have reviewed the triage vital signs and the nursing notes. ? ?Pertinent labs & imaging results that were available during my care of the patient were reviewed by me and considered in my medical decision making (see chart for details). ? ?  ? ?No indication for viral testing as patient denies significant URI symptoms and reports current symptoms are similar to previous episodes of asthma exacerbation.  He was given 80 mg of Solu-Medrol in clinic and albuterol inhaler with improvement of symptoms.  Discussed given recurrent symptoms he would benefit from maintenance medication, however, he is currently without insurance and cannot afford inhaled corticosteroid therapies at this time.  He is working to get insurance and he was  encouraged to return once he has insurance or can otherwise afford this medication.  He was started on prednisone taper with instruction not to take NSAIDs.  Refill of albuterol inhaler was sent to pharmacy.  Discussed that if he has any worsening symptoms including persistent shortness of breath/wheezing despite albuterol, chest pain, lightheadedness, worsening cough he needs to be evaluated immediately.  Strict return precautions given to which he expressed  understanding. ? ?Final Clinical Impressions(s) / UC Diagnoses  ? ?Final diagnoses:  ?Moderate persistent asthma with exacerbation  ? ? ? ?Discharge Instructions   ? ?  ?I believe that you are having an asthma flare.  Please continue using your albuterol inhaler as needed.  We gave you an injection of steroids today.  Please start prednisone taper tomorrow (03/15/2022).  Do not take NSAIDs with this medication including aspirin, ibuprofen/Advil, naproxen/Aleve as it can cause stomach bleeding.  As we discussed, you would likely benefit from a maintenance medication once you have your insurance figured out please return so we can consider starting this.  If at any point anything worsens and you have shortness of breath/wheezing is not responding to medication, worsening cough, lightheadedness you need to go to the emergency room immediately. ? ? ? ? ?ED Prescriptions   ? ? Medication Sig Dispense Auth. Provider  ? predniSONE (STERAPRED UNI-PAK 21 TAB) 10 MG (21) TBPK tablet As directed 21 tablet Edelmiro Innocent K, PA-C  ? albuterol (VENTOLIN HFA) 108 (90 Base) MCG/ACT inhaler Inhale 2 puffs into the lungs every 4 (four) hours as needed for wheezing. 18 g Sharren Schnurr K, PA-C  ? ?  ? ?PDMP not reviewed this encounter. ?  ?Jeani Hawking, PA-C ?03/14/22 0919 ? ?

## 2022-03-14 NOTE — ED Triage Notes (Signed)
Pt is present today w/o SOB and wheezing. Pt states that he has a hx of asthma and ran out of his albuterol yesterday  ?

## 2022-04-26 ENCOUNTER — Emergency Department (HOSPITAL_COMMUNITY)
Admission: EM | Admit: 2022-04-26 | Discharge: 2022-04-26 | Disposition: A | Discharge status: Home / Self Care | Payer: Self-pay | Attending: Emergency Medicine | Admitting: Emergency Medicine

## 2022-04-26 ENCOUNTER — Emergency Department (HOSPITAL_COMMUNITY): Payer: Self-pay

## 2022-04-26 ENCOUNTER — Other Ambulatory Visit: Payer: Self-pay

## 2022-04-26 DIAGNOSIS — R112 Nausea with vomiting, unspecified: Secondary | ICD-10-CM | POA: Insufficient documentation

## 2022-04-26 DIAGNOSIS — Z7951 Long term (current) use of inhaled steroids: Secondary | ICD-10-CM | POA: Insufficient documentation

## 2022-04-26 DIAGNOSIS — R059 Cough, unspecified: Secondary | ICD-10-CM | POA: Insufficient documentation

## 2022-04-26 DIAGNOSIS — R109 Unspecified abdominal pain: Secondary | ICD-10-CM | POA: Insufficient documentation

## 2022-04-26 DIAGNOSIS — J45909 Unspecified asthma, uncomplicated: Secondary | ICD-10-CM | POA: Insufficient documentation

## 2022-04-26 LAB — COMPREHENSIVE METABOLIC PANEL
ALT: 15 U/L (ref 0–44)
AST: 19 U/L (ref 15–41)
Albumin: 4 g/dL (ref 3.5–5.0)
Alkaline Phosphatase: 57 U/L (ref 38–126)
Anion gap: 10 (ref 5–15)
BUN: 8 mg/dL (ref 6–20)
CO2: 25 mmol/L (ref 22–32)
Calcium: 9.5 mg/dL (ref 8.9–10.3)
Chloride: 105 mmol/L (ref 98–111)
Creatinine, Ser: 1.05 mg/dL (ref 0.61–1.24)
GFR, Estimated: >60 mL/min (ref >60)
Glucose, Bld: 104 mg/dL — ABNORMAL HIGH (ref 70–99)
Potassium: 3.5 mmol/L (ref 3.5–5.1)
Sodium: 140 mmol/L (ref 135–145)
Total Bilirubin: 1 mg/dL (ref 0.3–1.2)
Total Protein: 7.9 g/dL (ref 6.5–8.1)

## 2022-04-26 LAB — URINALYSIS, ROUTINE W REFLEX MICROSCOPIC
Bilirubin Urine: NEGATIVE
Glucose, UA: NEGATIVE mg/dL
Hgb urine dipstick: NEGATIVE
Ketones, ur: NEGATIVE mg/dL
Leukocytes,Ua: NEGATIVE
Nitrite: NEGATIVE
Protein, ur: NEGATIVE mg/dL
Specific Gravity, Urine: 1.001 — ABNORMAL LOW (ref 1.005–1.030)
pH: 9 — ABNORMAL HIGH (ref 5.0–8.0)

## 2022-04-26 LAB — LIPASE, BLOOD: Lipase: 22 U/L (ref 11–51)

## 2022-04-26 LAB — CBC
HCT: 46.7 % (ref 39.0–52.0)
Hemoglobin: 15.2 g/dL (ref 13.0–17.0)
MCH: 29.9 pg (ref 26.0–34.0)
MCHC: 32.5 g/dL (ref 30.0–36.0)
MCV: 91.9 fL (ref 80.0–100.0)
Platelets: 273 10*3/uL (ref 150–400)
RBC: 5.08 MIL/uL (ref 4.22–5.81)
RDW: 12.5 % (ref 11.5–15.5)
WBC: 6 10*3/uL (ref 4.0–10.5)
nRBC: 0 % (ref 0.0–0.2)

## 2022-04-26 MED ORDER — HYDROCOD POLI-CHLORPHE POLI ER 10-8 MG/5ML PO SUER
5.0000 mL | Freq: Once | ORAL | Status: AC
  Administered 2022-04-26: 5 mL via ORAL
  Filled 2022-04-26: qty 5

## 2022-04-26 MED ORDER — DEXAMETHASONE 4 MG PO TABS
10.0000 mg | ORAL_TABLET | Freq: Once | ORAL | Status: AC
  Administered 2022-04-26: 10 mg via ORAL
  Filled 2022-04-26: qty 3

## 2022-04-26 MED ORDER — IPRATROPIUM-ALBUTEROL 0.5-2.5 (3) MG/3ML IN SOLN
3.0000 mL | Freq: Once | RESPIRATORY_TRACT | Status: DC
  Filled 2022-04-26: qty 3

## 2022-04-26 MED ORDER — SODIUM CHLORIDE 0.9 % IV BOLUS
1000.0000 mL | Freq: Once | INTRAVENOUS | Status: AC
  Administered 2022-04-26: 1000 mL via INTRAVENOUS

## 2022-04-26 MED ORDER — ALBUTEROL SULFATE HFA 108 (90 BASE) MCG/ACT IN AERS
2.0000 | INHALATION_SPRAY | Freq: Once | RESPIRATORY_TRACT | Status: AC
  Administered 2022-04-26: 2 via RESPIRATORY_TRACT
  Filled 2022-04-26: qty 6.7

## 2022-04-26 MED ORDER — ONDANSETRON 4 MG PO TBDP
4.0000 mg | ORAL_TABLET | Freq: Once | ORAL | Status: AC
  Administered 2022-04-26: 4 mg via ORAL
  Filled 2022-04-26: qty 1

## 2022-04-26 MED ORDER — ONDANSETRON 4 MG PO TBDP: ORAL_TABLET | ORAL | 0 refills | Status: DC

## 2022-04-26 NOTE — ED Provider Notes (Signed)
[facility] Provider Note   CSN: [id number] Arrival date & time: 04/26/22  1135     History  Chief Complaint  Patient presents with   Abdominal Pain   Emesis    Thomas Houston is a 31 y.o. male.  31 yo M with a chief complaints of nausea and vomiting after drinking heavily last night.  The patient thinks that maybe his liver is damaged.  He also has been coughing quite a bit.  Has a history of asthma and thinks that it is flared up.  He denies any sick contacts.   Abdominal Pain Associated symptoms: vomiting   Emesis Associated symptoms: abdominal pain       Home Medications Prior to Admission medications   Medication Sig Start Date End Date Taking? Authorizing Provider  ondansetron (ZOFRAN-ODT) 4 MG disintegrating tablet 4mg  ODT q4 hours prn nausea/vomit 04/26/22  Yes Adela LankFloyd, Macala Baldonado, DO  albuterol (PROVENTIL) (2.5 MG/3ML) 0.083% nebulizer solution Take 3 mLs (2.5 mg total) by nebulization every 4 (four) hours as needed for wheezing or shortness of breath. 11/21/21   Raspet, Noberto RetortErin K, PA-C  albuterol (VENTOLIN HFA) 108 (90 Base) MCG/ACT inhaler Inhale 2 puffs into the lungs every 4 (four) hours as needed for wheezing. 03/14/22   Raspet, Noberto RetortErin K, PA-C  predniSONE (STERAPRED UNI-PAK 21 TAB) 10 MG (21) TBPK tablet As directed 03/14/22   Raspet, Noberto RetortErin K, PA-C      Allergies    Other    Review of Systems   Review of Systems  Gastrointestinal:  Positive for abdominal pain and vomiting.   Physical Exam Updated Vital Signs BP (!) 152/99   Pulse 60   Temp 97.9 F (36.6 C) (Oral)   Resp 16   SpO2 99%  Physical Exam Vitals and nursing note reviewed.  Constitutional:      Appearance: He is well-developed.  HENT:     Head: Normocephalic and atraumatic.  Eyes:     Pupils: Pupils are equal, round, and reactive to light.  Neck:     Vascular: No JVD.  Cardiovascular:     Rate and Rhythm: Normal rate and regular rhythm.     Heart sounds: No  murmur heard.   No friction rub. No gallop.  Pulmonary:     Effort: No respiratory distress.     Breath sounds: No wheezing.  Abdominal:     General: There is no distension.     Tenderness: There is abdominal tenderness (mild diffuse discomfort). There is no guarding or rebound.  Musculoskeletal:        General: Normal range of motion.     Cervical back: Normal range of motion and neck supple.  Skin:    Coloration: Skin is not pale.     Findings: No rash.  Neurological:     Mental Status: He is alert and oriented to person, place, and time.  Psychiatric:        Behavior: Behavior normal.    ED Results / Procedures / Treatments   Labs (all labs ordered are listed, but only abnormal results are displayed) Labs Reviewed  COMPREHENSIVE METABOLIC PANEL - Abnormal; Notable for the following components:      Result Value   Glucose, Bld 104 (*)    All other components within normal limits  URINALYSIS, ROUTINE W REFLEX MICROSCOPIC - Abnormal; Notable for the following components:   Color, Urine COLORLESS (*)    Specific Gravity, Urine 1.001 (*)    pH 9.0 (*)  All other components within normal limits  LIPASE, BLOOD  CBC    EKG None  Radiology DG Chest 1 View  Result Date: 04/26/2022 CLINICAL DATA:  Abdominal pain EXAM: CHEST  1 VIEW COMPARISON:  09/29/2021 FINDINGS: The heart size and mediastinal contours are within normal limits. Both lungs are clear. The visualized skeletal structures are unremarkable. IMPRESSION: No acute abnormality of the lungs in AP portable projection. Electronically Signed   By: Jearld Lesch M.D.   On: 04/26/2022 12:41    Procedures Procedures    Medications Ordered in ED Medications  ipratropium-albuterol (DUONEB) 0.5-2.5 (3) MG/3ML nebulizer solution 3 mL (3 mLs Nebulization Patient Refused/Not Given 04/26/22 1303)  ondansetron (ZOFRAN-ODT) disintegrating tablet 4 mg (4 mg Oral Given 04/26/22 1157)  sodium chloride 0.9 % bolus 1,000 mL (0 mLs  Intravenous Stopped 04/26/22 1448)  chlorpheniramine-HYDROcodone 10-8 MG/5ML suspension 5 mL (5 mLs Oral Given 04/26/22 1302)  albuterol (VENTOLIN HFA) 108 (90 Base) MCG/ACT inhaler 2 puff (2 puffs Inhalation Given 04/26/22 1445)  dexamethasone (DECADRON) tablet 10 mg (10 mg Oral Given 04/26/22 1445)    ED Course/ Medical Decision Making/ A&P                           Medical Decision Making Amount and/or Complexity of Data Reviewed Labs: ordered. Radiology: ordered.  Risk Prescription drug management.   30 yo M with a chief complaint of nausea and vomiting after drinking heavily last night.  Patient is well-appearing and nontoxic.  He is coughing quite a bit and so I obtained a chest x-ray to assess for possible aspiration while intoxicated.  Independently interpreted by me negative for focal infiltrate especially in the right lower lobe region.  He has no leukocytosis no anemia.  We will give a bolus of IV fluids treat with antiemetics 1 single DuoNeb's he tells me that its similar to his asthma though has no having to just lung sounds on exam.  LFTs and lipase are unremarkable.  No anemia.  Chest x-ray independently interpreted by me without focal obstructive pneumothorax.  Patient feeling better.  Tolerate by mouth.  Discharged home.  Patient having trouble obtaining inhaler.  Will give one here.   3:07 PM:  I have discussed the diagnosis/risks/treatment options with the patient.  Evaluation and diagnostic testing in the emergency department does not suggest an emergent condition requiring admission or immediate intervention beyond what has been performed at this time.  They will follow up with  PCP. We also discussed returning to the ED immediately if new or worsening sx occur. We discussed the sx which are most concerning (e.g., sudden worsening pain, fever, inability to tolerate by mouth) that necessitate immediate return. Medications administered to the patient during their visit and any  new prescriptions provided to the patient are listed below.  Medications given during this visit Medications  ipratropium-albuterol (DUONEB) 0.5-2.5 (3) MG/3ML nebulizer solution 3 mL (3 mLs Nebulization Patient Refused/Not Given 04/26/22 1303)  ondansetron (ZOFRAN-ODT) disintegrating tablet 4 mg (4 mg Oral Given 04/26/22 1157)  sodium chloride 0.9 % bolus 1,000 mL (0 mLs Intravenous Stopped 04/26/22 1448)  chlorpheniramine-HYDROcodone 10-8 MG/5ML suspension 5 mL (5 mLs Oral Given 04/26/22 1302)  albuterol (VENTOLIN HFA) 108 (90 Base) MCG/ACT inhaler 2 puff (2 puffs Inhalation Given 04/26/22 1445)  dexamethasone (DECADRON) tablet 10 mg (10 mg Oral Given 04/26/22 1445)     The patient appears reasonably screen and/or stabilized for discharge and I doubt any  other medical condition or other [facility] requiring further screening, evaluation, or treatment in the ED at this time prior to discharge.          Final Clinical Impression(s) / ED Diagnoses Final diagnoses:  Nausea and vomiting in adult    Rx / DC Orders ED Discharge Orders          Ordered    ondansetron (ZOFRAN-ODT) 4 MG disintegrating tablet        04/26/22 1425              [place], DO 04/26/22 1507

## 2022-04-26 NOTE — ED Triage Notes (Signed)
Pt reports generalized abdominal pain, n/v since this morning. Thinks it is "liver poisoning" and endorses heavy ETOH last night.

## 2022-04-26 NOTE — Discharge Instructions (Signed)
You can use your asthma inhaler up to every 4 hours while you are awake.  If you have to use it more often that the need to come back to the ER for evaluation.  Please follow-up with your family doctor in the office

## 2022-05-19 IMAGING — DX DG CHEST 1V PORT
1 series · 1 of 1 positions shown · non-contrast
Comparison: Chest radiographs 11/11/2014 and earlier.

CLINICAL DATA: 30-year-old male with shortness of breath and chest
pain.

EXAM:
PORTABLE CHEST 1 VIEW

[chest ap]
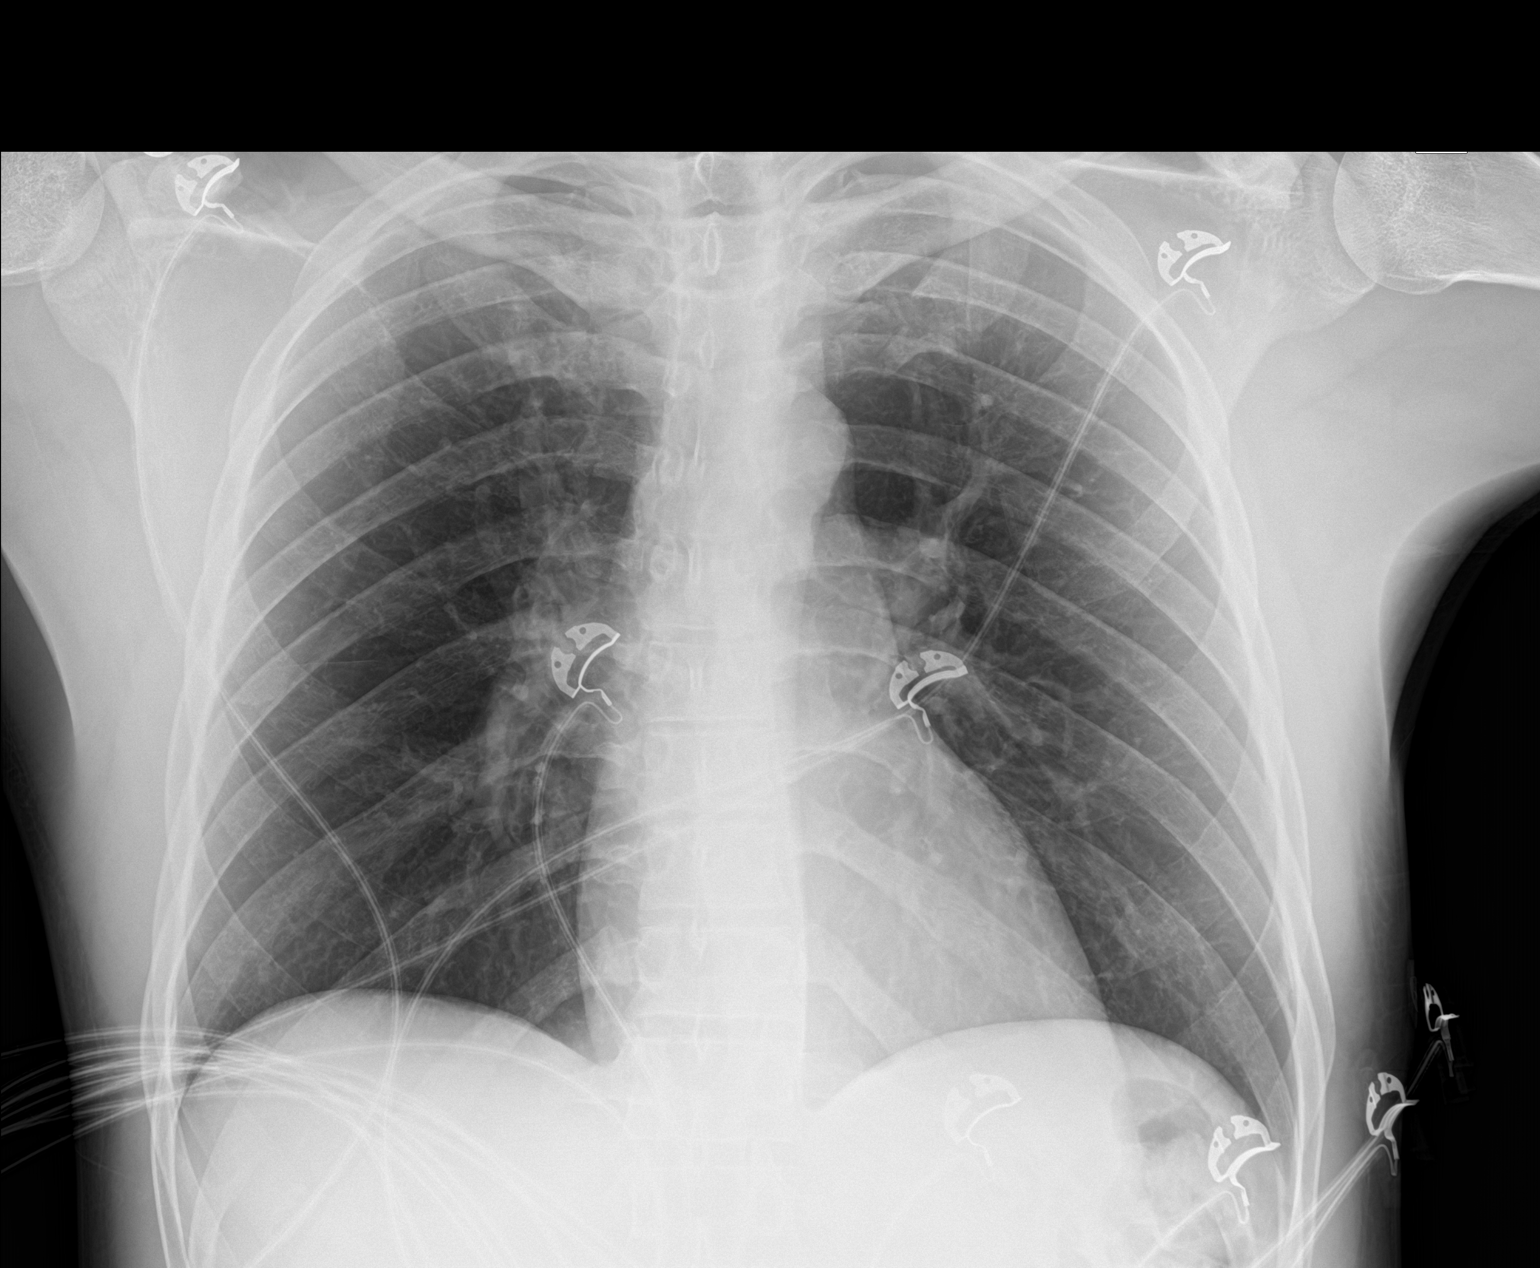

[1 of 1 positions shown; findings below may reference images not displayed]

FINDINGS: Portable AP semi upright view at 0040 hours. Lung volumes and
mediastinal contours remain normal. Visualized tracheal air column
is within normal limits. Allowing for portable technique the lungs
are clear. No pneumothorax or pleural effusion. No osseous
abnormality identified. Negative visible bowel gas.
IMPRESSION: Negative portable chest.

## 2022-05-28 ENCOUNTER — Ambulatory Visit (HOSPITAL_COMMUNITY)
Admission: EM | Admit: 2022-05-28 | Discharge: 2022-05-28 | Disposition: A | Discharge status: Home / Self Care | Payer: Medicaid Other

## 2022-05-31 ENCOUNTER — Ambulatory Visit (HOSPITAL_COMMUNITY)
Admission: EM | Admit: 2022-05-31 | Discharge: 2022-05-31 | Disposition: A | Discharge status: Home / Self Care | Payer: Medicaid Other | Attending: Physician Assistant | Admitting: Physician Assistant

## 2022-05-31 ENCOUNTER — Encounter (HOSPITAL_COMMUNITY): Payer: Self-pay | Admitting: Emergency Medicine

## 2022-05-31 DIAGNOSIS — J4541 Moderate persistent asthma with (acute) exacerbation: Secondary | ICD-10-CM

## 2022-05-31 MED ORDER — ALBUTEROL SULFATE HFA 108 (90 BASE) MCG/ACT IN AERS
INHALATION_SPRAY | RESPIRATORY_TRACT | Status: AC
  Filled 2022-05-31: qty 6.7

## 2022-05-31 MED ORDER — ALBUTEROL SULFATE HFA 108 (90 BASE) MCG/ACT IN AERS
2.0000 | INHALATION_SPRAY | Freq: Once | RESPIRATORY_TRACT | Status: AC
  Administered 2022-05-31: 2 via RESPIRATORY_TRACT

## 2022-05-31 MED ORDER — PREDNISONE 10 MG (21) PO TBPK
ORAL_TABLET | ORAL | 0 refills | Status: DC
Start: 2022-05-31 — End: 2023-04-15

## 2022-05-31 MED ORDER — BUDESONIDE-FORMOTEROL FUMARATE 80-4.5 MCG/ACT IN AERO: 2.0000 | INHALATION_SPRAY | Freq: Two times a day (BID) | RESPIRATORY_TRACT | 2 refills | Status: DC

## 2022-05-31 MED ORDER — ALBUTEROL SULFATE HFA 108 (90 BASE) MCG/ACT IN AERS: 2.0000 | INHALATION_SPRAY | RESPIRATORY_TRACT | 1 refills | Status: DC | PRN

## 2022-05-31 NOTE — ED Triage Notes (Signed)
Pt c/o dry cough and wheezing with asthma since Wed. Reports keeping him up at night. Reports out of inhaler.

## 2022-05-31 NOTE — Discharge Instructions (Signed)
That you are having an asthma flare.  I am glad that you are feeling better with your albuterol.  I have sent a refill of this to your pharmacy.  Start prednisone taper.  Do not take NSAIDs including aspirin, ibuprofen/Advil, naproxen/Aleve with this medication as it can cause stomach bleeding.  I also want you to start Symbicort to try to prevent asthma symptoms.  Take this twice a day.  Make sure to rinse your mouth following use of this medication to prevent thrush.  If you have any worsening symptoms you need to be seen immediately.

## 2022-05-31 NOTE — ED Provider Notes (Signed)
[facility]    CSN: [id number] Arrival date & time: 05/31/22  0806      History   Chief Complaint Chief Complaint  Patient presents with   Cough   Asthma    HPI Thomas Houston is a 31 y.o. male.   Patient presents today with a 3-day history of recurrent asthma symptoms.  He does have a history of asthma but has been without his albuterol inhaler as he ran out of this medication.  He denies previously using maintenance medication.  He was hospitalized for asthma when he was much younger but denies previous intubation.  Reports cough, shortness of breath, chest tightness.  Denies any fever, congestion, sore throat, nausea/vomiting.  He denies any recent antibiotic or steroid use.  He was last given prednisone taper April 2023 which provided resolution of symptoms.  He denies any known sick contacts.  He is having difficulty with daily activities as result of symptoms.    Past Medical History:  Diagnosis Date   Asthma     There are no problems to display for this patient.   History reviewed. No pertinent surgical history.     Home Medications    Prior to Admission medications   Medication Sig Start Date End Date Taking? Authorizing Provider  budesonide-formoterol (SYMBICORT) 80-4.5 MCG/ACT inhaler Inhale 2 puffs into the lungs 2 (two) times daily. 05/31/22  Yes Miki Labuda K, PA-C  albuterol (VENTOLIN HFA) 108 (90 Base) MCG/ACT inhaler Inhale 2 puffs into the lungs every 4 (four) hours as needed for wheezing. 05/31/22   Oluwatimilehin Balfour, Noberto Retort, PA-C  predniSONE (STERAPRED UNI-PAK 21 TAB) 10 MG (21) TBPK tablet As directed 05/31/22   Quentyn Kolbeck, Noberto Retort, PA-C    Family History Family History  Problem Relation Age of Onset   Asthma Mother    Hypertension Mother     Social History Social History   Tobacco Use   Smoking status: Some Days    Packs/day: 3.00    Types: Cigarettes   Smokeless tobacco: Never  Substance Use Topics   Alcohol use: No   Drug use: No      Allergies   Other   Review of Systems Review of Systems  Constitutional:  Positive for activity change. Negative for appetite change, fatigue and fever.  HENT:  Negative for congestion, sinus pressure, sneezing and sore throat.   Respiratory:  Positive for cough, chest tightness, shortness of breath and wheezing.   Cardiovascular:  Negative for chest pain.  Gastrointestinal:  Negative for abdominal pain, diarrhea, nausea and vomiting.  Neurological:  Negative for dizziness, light-headedness and headaches.     Physical Exam Triage Vital Signs ED Triage Vitals  Enc Vitals Group     BP 05/31/22 0907 (!) 148/83     Pulse Rate 05/31/22 0907 70     Resp 05/31/22 0907 18     Temp 05/31/22 0907 98 F (36.7 C)     Temp Source 05/31/22 0907 Oral     SpO2 05/31/22 0907 97 %     Weight --      Height --      Head Circumference --      Peak Flow --      Pain Score 05/31/22 0906 5     Pain Loc --      Pain Edu? --      Excl. in GC? --    No data found.  Updated Vital Signs BP (!) 148/83 (BP Location: Left Arm)  Pulse 70   Temp 98 F (36.7 C) (Oral)   Resp 18   SpO2 97%   Visual Acuity Right Eye Distance:   Left Eye Distance:   Bilateral Distance:    Right Eye Near:   Left Eye Near:    Bilateral Near:     Physical Exam Vitals reviewed.  Constitutional:      General: He is awake.     Appearance: Normal appearance. He is well-developed. He is not ill-appearing.     Comments: Very pleasant male appears stated age in no acute distress sitting comfortably in exam room  HENT:     Head: Normocephalic and atraumatic.     Right Ear: Tympanic membrane, ear canal and external ear normal. Tympanic membrane is not erythematous or bulging.     Left Ear: Tympanic membrane, ear canal and external ear normal. Tympanic membrane is not erythematous or bulging.     Nose: Nose normal.     Mouth/Throat:     Pharynx: Uvula midline. No oropharyngeal exudate, posterior  oropharyngeal erythema or uvula swelling.  Cardiovascular:     Rate and Rhythm: Normal rate and regular rhythm.     Heart sounds: Normal heart sounds, S1 normal and S2 normal. No murmur heard. Pulmonary:     Effort: Pulmonary effort is normal. No accessory muscle usage or respiratory distress.     Breath sounds: No stridor. Examination of the right-lower field reveals decreased breath sounds. Examination of the left-lower field reveals decreased breath sounds. Decreased breath sounds and wheezing present. No rhonchi or rales.     Comments: Scattered wheezing and decreased aeration bilateral bases Abdominal:     Palpations: Abdomen is soft.     Tenderness: There is no abdominal tenderness.  Neurological:     Mental Status: He is alert.  Psychiatric:        Behavior: Behavior is cooperative.      UC Treatments / Results  Labs (all labs ordered are listed, but only abnormal results are displayed) Labs Reviewed - No data to display  EKG   Radiology No results found.  Procedures Procedures (including critical care time)  Medications Ordered in UC Medications  albuterol (VENTOLIN HFA) 108 (90 Base) MCG/ACT inhaler 2 puff (2 puffs Inhalation Given 05/31/22 0955)    Initial Impression / Assessment and Plan / UC Course  I have reviewed the triage vital signs and the nursing notes.  Pertinent labs & imaging results that were available during my care of the patient were reviewed by me and considered in my medical decision making (see chart for details).     Patient is well-appearing, afebrile, nontoxic, with oxygen saturation of 97%.  He had improvement of symptoms with albuterol in clinic today.  He was sent home with an albuterol inhaler and refill of medication was sent to pharmacy as well.  We will start prednisone taper and he was instructed not to take NSAIDs with this medication due to risk of GI bleeding.  Given recurrent asthma flares will start maintenance medication of  Symbicort.  Discussed the importance of rinsing mouth following use of this medication to prevent thrush.  Discussed that if symptoms or not improving with current treatment plan or if at any point anything worsens he needs to be reevaluated.  If he develops any chest pain, shortness of breath, persistent wheezing despite medication, worsening cough, fever, fatigue, malaise you need to be seen immediately to which he expressed understanding.  Strict return precautions given.  Work excuse  note provided.  Final Clinical Impressions(s) / UC Diagnoses   Final diagnoses:  Moderate persistent asthma with exacerbation     Discharge Instructions      That you are having an asthma flare.  I am glad that you are feeling better with your albuterol.  I have sent a refill of this to your pharmacy.  Start prednisone taper.  Do not take NSAIDs including aspirin, ibuprofen/Advil, naproxen/Aleve with this medication as it can cause stomach bleeding.  I also want you to start Symbicort to try to prevent asthma symptoms.  Take this twice a day.  Make sure to rinse your mouth following use of this medication to prevent thrush.  If you have any worsening symptoms you need to be seen immediately.     ED Prescriptions     Medication Sig Dispense Auth. Provider   albuterol (VENTOLIN HFA) 108 (90 Base) MCG/ACT inhaler Inhale 2 puffs into the lungs every 4 (four) hours as needed for wheezing. 18 g Trishna Cwik K, PA-C   predniSONE (STERAPRED UNI-PAK 21 TAB) 10 MG (21) TBPK tablet As directed 21 tablet Lidya Mccalister K, PA-C   budesonide-formoterol (SYMBICORT) 80-4.5 MCG/ACT inhaler Inhale 2 puffs into the lungs 2 (two) times daily. 1 each Lezly Rumpf, Noberto Retort, PA-C      PDMP not reviewed this encounter.   Jeani Hawking, PA-C 05/31/22 1024

## 2022-07-08 ENCOUNTER — Encounter (HOSPITAL_COMMUNITY): Payer: Self-pay | Admitting: Emergency Medicine

## 2022-07-08 ENCOUNTER — Ambulatory Visit (HOSPITAL_COMMUNITY)
Admission: EM | Admit: 2022-07-08 | Discharge: 2022-07-08 | Disposition: A | Discharge status: Home / Self Care | Payer: Commercial Managed Care - HMO | Attending: Physician Assistant | Admitting: Physician Assistant

## 2022-07-08 DIAGNOSIS — J4531 Mild persistent asthma with (acute) exacerbation: Secondary | ICD-10-CM

## 2022-07-08 MED ORDER — PREDNISONE 20 MG PO TABS: 40.0000 mg | ORAL_TABLET | Freq: Every day | ORAL | 0 refills | Status: DC

## 2022-07-08 MED ORDER — ALBUTEROL SULFATE HFA 108 (90 BASE) MCG/ACT IN AERS: 2.0000 | INHALATION_SPRAY | RESPIRATORY_TRACT | 1 refills | Status: DC | PRN

## 2022-07-08 NOTE — Discharge Instructions (Signed)
Take medication as prescribed Return if no improvement or worsening symptoms

## 2022-07-08 NOTE — ED Provider Notes (Signed)
[facility]    CSN: [id number] Arrival date & time: 07/08/22  6962      History   Chief Complaint Chief Complaint  Patient presents with   Shortness of Breath    HPI Thomas Houston is a 31 y.o. male.   Patient here c/w "medication refill" he is requesting refill of albuterol inhaler.  He normally takes it twice daily, but ran out 2 days ago.  Since he ran out he reports wheezing and SOB.  Denies any other sx.    Past Medical History:  Diagnosis Date   Asthma     There are no problems to display for this patient.   History reviewed. No pertinent surgical history.     Home Medications    Prior to Admission medications   Medication Sig Start Date End Date Taking? Authorizing Provider  albuterol (VENTOLIN HFA) 108 (90 Base) MCG/ACT inhaler Inhale 2 puffs into the lungs every 4 (four) hours as needed for wheezing. 05/31/22  Yes Raspet, Erin K, PA-C  albuterol (VENTOLIN HFA) 108 (90 Base) MCG/ACT inhaler Inhale 2 puffs into the lungs every 4 (four) hours as needed for wheezing or shortness of breath. 07/08/22  Yes Evern Core, PA-C  budesonide-formoterol (SYMBICORT) 80-4.5 MCG/ACT inhaler Inhale 2 puffs into the lungs 2 (two) times daily. 05/31/22  Yes Raspet, Noberto Retort, PA-C  predniSONE (DELTASONE) 20 MG tablet Take 2 tablets (40 mg total) by mouth daily with breakfast. 07/08/22  Yes Evern Core, PA-C  predniSONE (STERAPRED UNI-PAK 21 TAB) 10 MG (21) TBPK tablet As directed 05/31/22   Raspet, Noberto Retort, PA-C    Family History Family History  Problem Relation Age of Onset   Asthma Mother    Hypertension Mother     Social History Social History   Tobacco Use   Smoking status: Some Days    Packs/day: 3.00    Types: Cigarettes   Smokeless tobacco: Never  Substance Use Topics   Alcohol use: No   Drug use: No     Allergies   Other   Review of Systems Review of Systems  Constitutional:  Negative for chills and fever.  HENT:  Negative for congestion,  ear pain, rhinorrhea and sore throat.   Eyes:  Negative for pain and visual disturbance.  Respiratory:  Positive for shortness of breath and wheezing. Negative for cough.   Cardiovascular:  Negative for chest pain and palpitations.  Gastrointestinal:  Negative for abdominal pain, nausea and vomiting.  Genitourinary:  Negative for dysuria and hematuria.  Musculoskeletal:  Negative for arthralgias and back pain.  Skin:  Negative for color change and rash.  Neurological:  Negative for seizures and syncope.  All other systems reviewed and are negative.    Physical Exam Triage Vital Signs ED Triage Vitals  Enc Vitals Group     BP 07/08/22 0914 (!) 136/93     Pulse Rate 07/08/22 0914 61     Resp 07/08/22 0914 18     Temp 07/08/22 0914 98.2 F (36.8 C)     Temp Source 07/08/22 0914 Oral     SpO2 07/08/22 0914 96 %     Weight 07/08/22 0915 190 lb (86.2 kg)     Height 07/08/22 0915 6' (1.829 m)     Head Circumference --      Peak Flow --      Pain Score 07/08/22 0915 0     Pain Loc --      Pain Edu? --  Excl. in GC? --    No data found.  Updated Vital Signs BP (!) 136/93 (BP Location: Right Arm)   Pulse 61   Temp 98.2 F (36.8 C) (Oral)   Resp 18   Ht 6' (1.829 m)   Wt 190 lb (86.2 kg)   SpO2 96%   BMI 25.77 kg/m   Visual Acuity Right Eye Distance:   Left Eye Distance:   Bilateral Distance:    Right Eye Near:   Left Eye Near:    Bilateral Near:     Physical Exam Vitals and nursing note reviewed.  Constitutional:      General: He is not in acute distress.    Appearance: Normal appearance. He is well-developed. He is not ill-appearing.  HENT:     Head: Normocephalic and atraumatic.     Right Ear: Tympanic membrane and ear canal normal.     Left Ear: Tympanic membrane and ear canal normal.     Nose: No congestion or rhinorrhea.     Mouth/Throat:     Pharynx: No oropharyngeal exudate or posterior oropharyngeal erythema.  Eyes:     General: No scleral  icterus.    Extraocular Movements: Extraocular movements intact.     Conjunctiva/sclera: Conjunctivae normal.  Cardiovascular:     Rate and Rhythm: Normal rate and regular rhythm.     Heart sounds: No murmur heard. Pulmonary:     Effort: Pulmonary effort is normal. No respiratory distress.     Breath sounds: Normal breath sounds. No wheezing or rales.     Comments: Speaking in full and complete sentences Musculoskeletal:        General: No swelling.     Cervical back: Normal range of motion and neck supple. No rigidity.  Skin:    General: Skin is warm and dry.     Coloration: Skin is not jaundiced.     Findings: No rash.  Neurological:     General: No focal deficit present.     Mental Status: He is alert and oriented to person, place, and time.     Motor: No weakness.     Gait: Gait normal.  Psychiatric:        Mood and Affect: Mood normal.        Behavior: Behavior normal.      UC Treatments / Results  Labs (all labs ordered are listed, but only abnormal results are displayed) Labs Reviewed - No data to display  EKG   Radiology No results found.  Procedures Procedures (including critical care time)  Medications Ordered in UC Medications - No data to display  Initial Impression / Assessment and Plan / UC Course  I have reviewed the triage vital signs and the nursing notes.  Pertinent labs & imaging results that were available during my care of the patient were reviewed by me and considered in my medical decision making (see chart for details).     Take medication, return if no improvement  Final Clinical Impressions(s) / UC Diagnoses   Final diagnoses:  Mild persistent asthma with exacerbation     Discharge Instructions      Take medication as prescribed Return if no improvement or worsening symptoms    ED Prescriptions     Medication Sig Dispense Auth. Provider   predniSONE (DELTASONE) 20 MG tablet Take 2 tablets (40 mg total) by mouth daily  with breakfast. 6 tablet Evern Core, PA-C   albuterol (VENTOLIN HFA) 108 (90 Base) MCG/ACT inhaler Inhale 2 puffs  into the lungs every 4 (four) hours as needed for wheezing or shortness of breath. 18 g Evern Core, PA-C      PDMP not reviewed this encounter.   Evern Core, PA-C 07/08/22 [phone]

## 2022-07-08 NOTE — ED Triage Notes (Signed)
Patient states that he has been having SOB x 2 days, ran out of his asthma pump and requesting a refill on it.  He needs his Albuterol Inhaler refilled.

## 2022-07-17 IMAGING — DX DG CHEST 1V PORT
1 series · 2 of 2 positions shown · non-contrast
Comparison: June 17, 2021

CLINICAL DATA: Shortness of breath.

EXAM:
PORTABLE CHEST 1 VIEW

[Series 1: chest ap · 0.14mm/px · 2 of 2 slices shown]
[im 1/2]
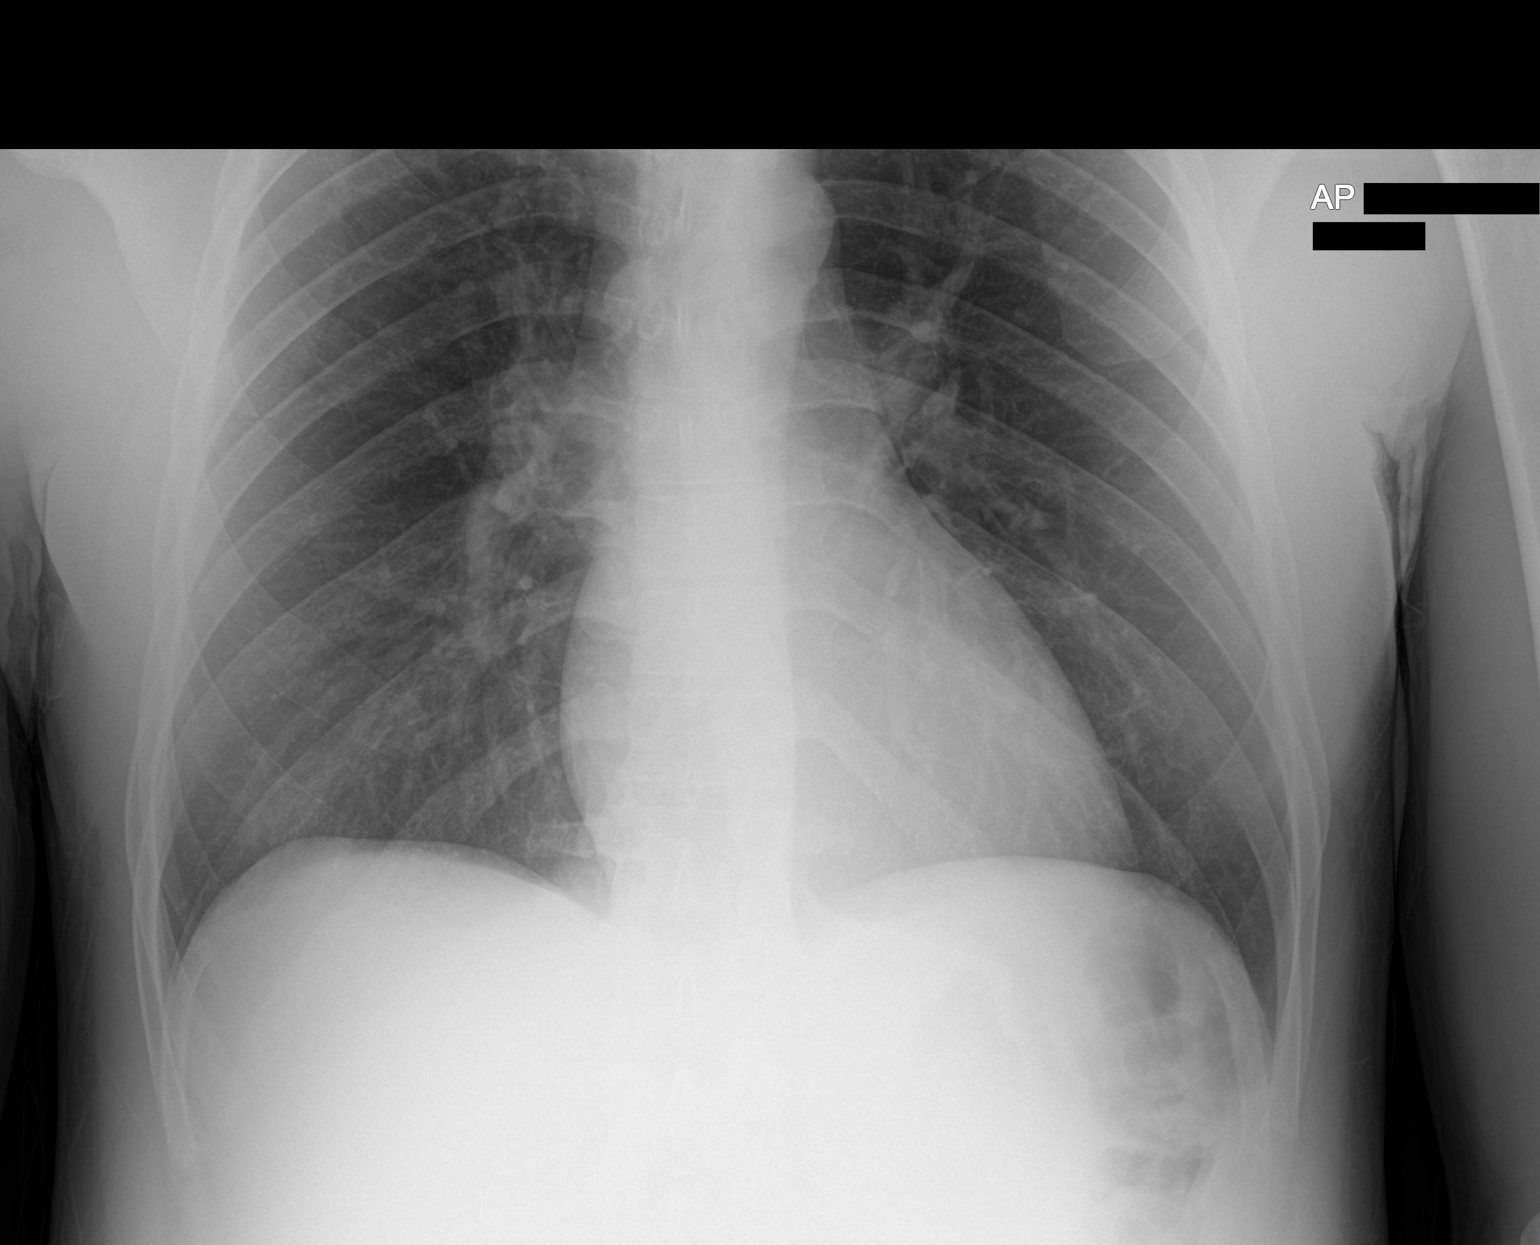
[im 2/2]
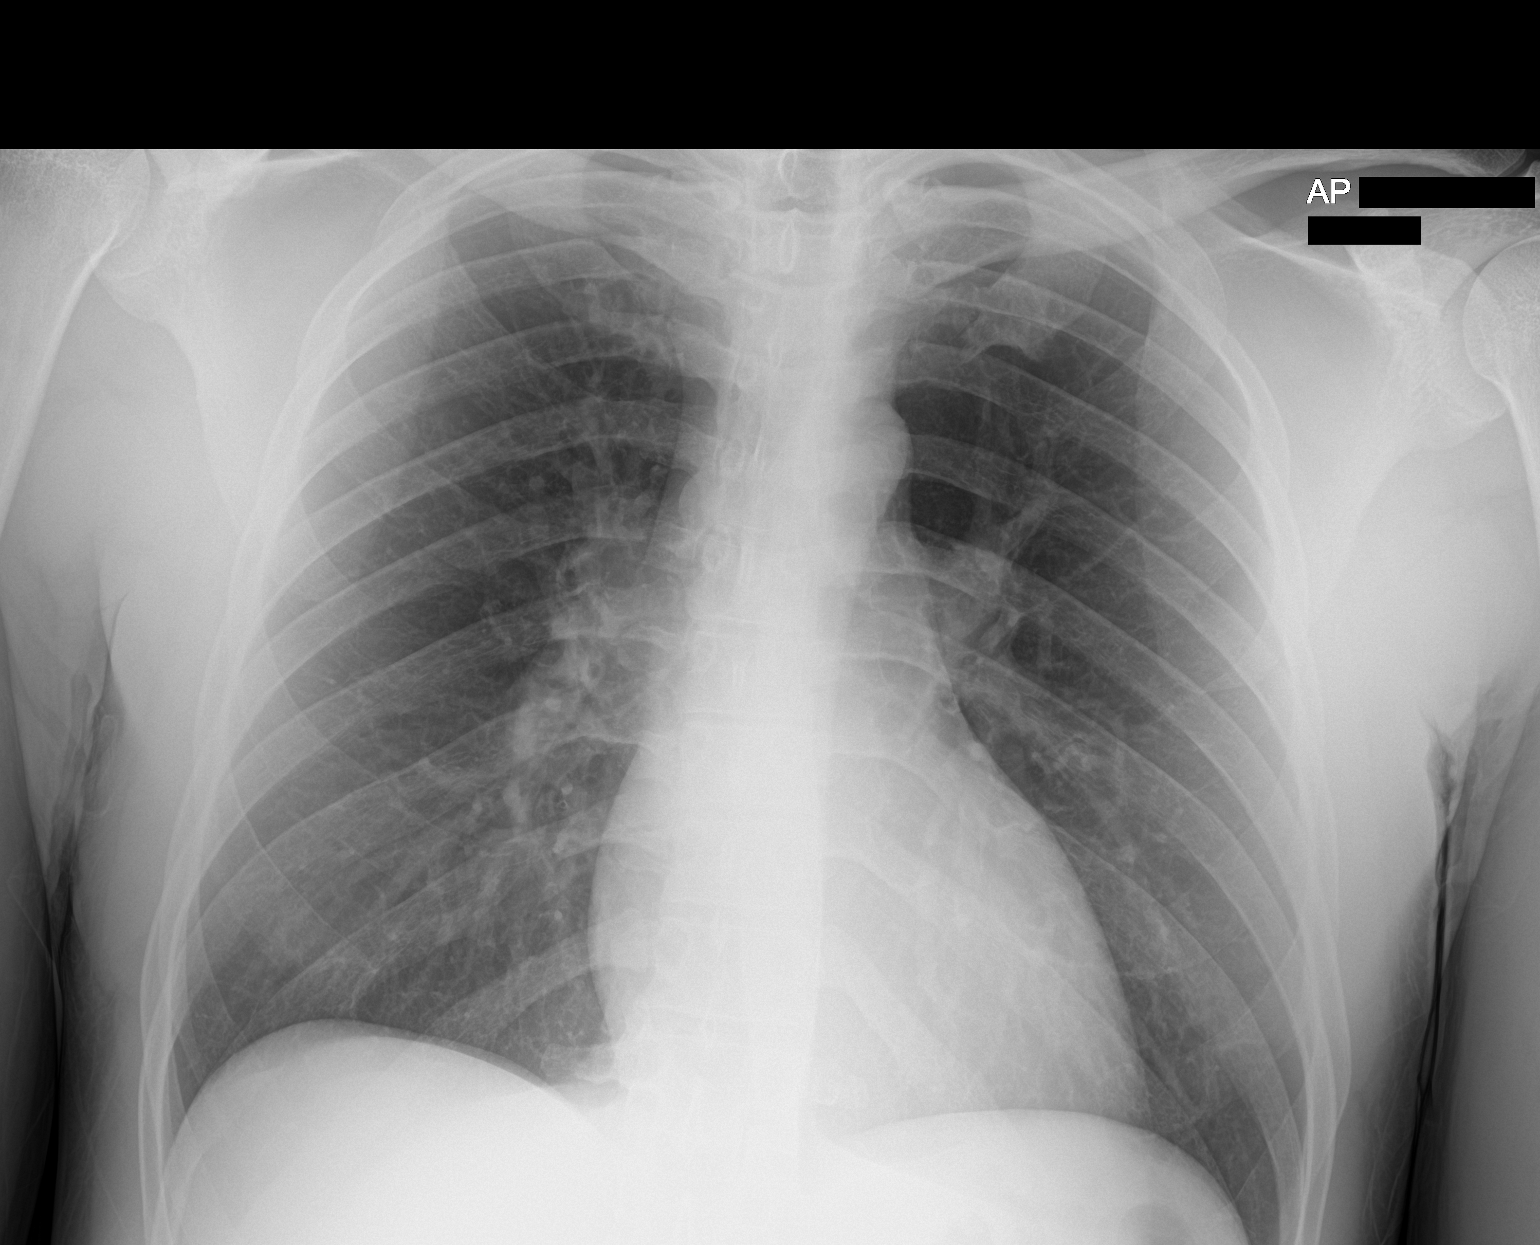

[2 of 2 positions shown; findings below may reference images not displayed]

FINDINGS: The heart size and mediastinal contours are within normal limits.
Both lungs are clear. The visualized skeletal structures are
unremarkable.
IMPRESSION: No active disease.

## 2023-03-28 IMAGING — DX DG CHEST 1V
1 series · 1 of 1 positions shown · non-contrast
Comparison: 09/29/2021

CLINICAL DATA: Abdominal pain

EXAM:
CHEST  1 VIEW

[chest ap]
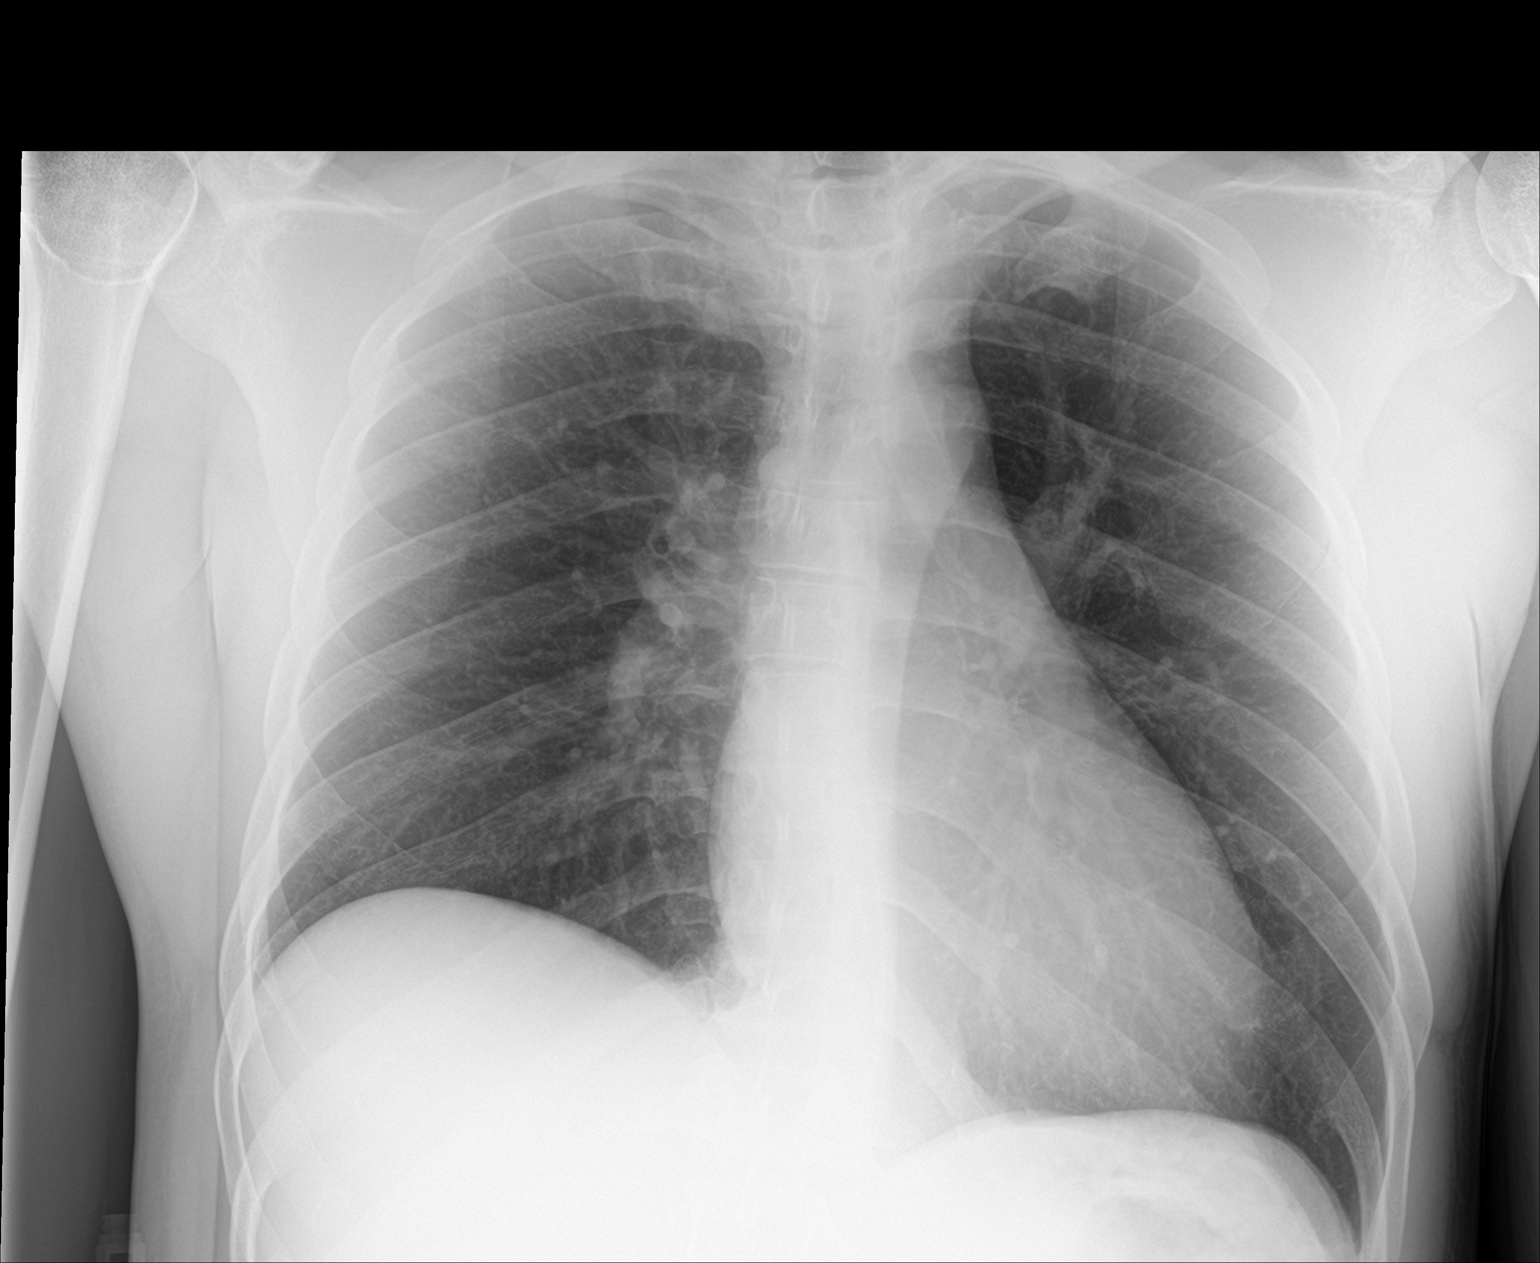

[1 of 1 positions shown; findings below may reference images not displayed]

FINDINGS: The heart size and mediastinal contours are within normal limits.
Both lungs are clear. The visualized skeletal structures are
unremarkable.
IMPRESSION: No acute abnormality of the lungs in AP portable projection.

## 2023-04-15 ENCOUNTER — Encounter (HOSPITAL_COMMUNITY): Payer: Self-pay

## 2023-04-15 ENCOUNTER — Ambulatory Visit (INDEPENDENT_AMBULATORY_CARE_PROVIDER_SITE_OTHER): Payer: Commercial Managed Care - HMO

## 2023-04-15 ENCOUNTER — Ambulatory Visit (HOSPITAL_COMMUNITY)
Admission: EM | Admit: 2023-04-15 | Discharge: 2023-04-15 | Disposition: A | Discharge status: Home / Self Care | Payer: Commercial Managed Care - HMO | Attending: Family Medicine | Admitting: Family Medicine

## 2023-04-15 DIAGNOSIS — J4521 Mild intermittent asthma with (acute) exacerbation: Secondary | ICD-10-CM | POA: Diagnosis not present

## 2023-04-15 MED ORDER — IPRATROPIUM-ALBUTEROL 0.5-2.5 (3) MG/3ML IN SOLN
RESPIRATORY_TRACT | Status: AC
Start: 2023-04-15 — End: ?
  Filled 2023-04-15: qty 3

## 2023-04-15 MED ORDER — ALBUTEROL SULFATE HFA 108 (90 BASE) MCG/ACT IN AERS: 2.0000 | INHALATION_SPRAY | RESPIRATORY_TRACT | 1 refills | Status: DC | PRN

## 2023-04-15 MED ORDER — ALBUTEROL SULFATE (2.5 MG/3ML) 0.083% IN NEBU
INHALATION_SOLUTION | RESPIRATORY_TRACT | Status: AC
  Filled 2023-04-15: qty 3

## 2023-04-15 MED ORDER — IPRATROPIUM-ALBUTEROL 0.5-2.5 (3) MG/3ML IN SOLN
3.0000 mL | Freq: Once | RESPIRATORY_TRACT | Status: AC
  Administered 2023-04-15: 3 mL via RESPIRATORY_TRACT

## 2023-04-15 MED ORDER — ALBUTEROL SULFATE (2.5 MG/3ML) 0.083% IN NEBU
2.5000 mg | INHALATION_SOLUTION | Freq: Once | RESPIRATORY_TRACT | Status: AC
  Administered 2023-04-15: 2.5 mg via RESPIRATORY_TRACT

## 2023-04-15 MED ORDER — METHYLPREDNISOLONE 4 MG PO TBPK: ORAL_TABLET | ORAL | 0 refills | Status: DC

## 2023-04-15 NOTE — ED Provider Notes (Signed)
[facility]    CSN: [id number] Arrival date & time: 04/15/23  1353      History   Chief Complaint Chief Complaint  Patient presents with   Cough   Chest Pain   Shortness of Breath    HPI Thomas Houston is a 32 y.o. male.   Patient is here for chest pain, cough and sob.  This started yesterday with cough and wheezing.  He does have h/o asthma, and using his home albuterol inhaler with help, but he ran out of this yesterday.  He woke up today with worsening cough, sob and felt that he could not breath.  Painful in his chest with taking a deep breath.  He feels tight, like when he has his asthma.  He has vomiting after coughing.  Upon arrival to the UC he appears anxious and sob.  He elects to stand as this makes him feel better.  He is requesting oxygen as he feels he cannot get enough in with breathing.   Upon review of his chart he has been seen here 7 times in the last year either for refills of inhaler or exacerbation of asthma.        Past Medical History:  Diagnosis Date   Asthma     There are no problems to display for this patient.   History reviewed. No pertinent surgical history.     Home Medications    Prior to Admission medications   Medication Sig Start Date End Date Taking? Authorizing Provider  albuterol (VENTOLIN HFA) 108 (90 Base) MCG/ACT inhaler Inhale 2 puffs into the lungs every 4 (four) hours as needed for wheezing. 05/31/22  Yes Raspet, Perrin Eddleman K, PA-C  budesonide-formoterol (SYMBICORT) 80-4.5 MCG/ACT inhaler Inhale 2 puffs into the lungs 2 (two) times daily. 05/31/22  Yes Raspet, Lilyahna Sirmon K, PA-C  albuterol (VENTOLIN HFA) 108 (90 Base) MCG/ACT inhaler Inhale 2 puffs into the lungs every 4 (four) hours as needed for wheezing or shortness of breath. 07/08/22   Evern Core, PA-C  predniSONE (DELTASONE) 20 MG tablet Take 2 tablets (40 mg total) by mouth daily with breakfast. 07/08/22   Evern Core, PA-C  predniSONE (STERAPRED UNI-PAK 21 TAB) 10  MG (21) TBPK tablet As directed 05/31/22   Raspet, Noberto Retort, PA-C    Family History Family History  Problem Relation Age of Onset   Asthma Mother    Hypertension Mother     Social History Social History   Tobacco Use   Smoking status: Some Days    Packs/day: 3    Types: Cigarettes   Smokeless tobacco: Never  Substance Use Topics   Alcohol use: No   Drug use: No     Allergies   Other   Review of Systems Review of Systems  Constitutional:  Negative for chills and fever.  Respiratory:  Positive for cough and shortness of breath.   Cardiovascular:  Positive for chest pain.  Gastrointestinal: Negative.   Musculoskeletal: Negative.   Psychiatric/Behavioral: Negative.       Physical Exam Triage Vital Signs ED Triage Vitals  Enc Vitals Group     BP 04/15/23 1404 (!) 168/95     Pulse Rate 04/15/23 1404 (!) 140     Resp --      Temp --      Temp src --      SpO2 04/15/23 1404 94 %     Weight --      Height --  Head Circumference --      Peak Flow --      Pain Score 04/15/23 1409 8     Pain Loc --      Pain Edu? --      Excl. in GC? --    No data found.  Updated Vital Signs BP (!) 168/95 (BP Location: Right Arm)   Pulse (!) 140   SpO2 96%   Visual Acuity Right Eye Distance:   Left Eye Distance:   Bilateral Distance:    Right Eye Near:   Left Eye Near:    Bilateral Near:     Physical Exam Constitutional:      General: He is in acute distress.     Appearance: He is well-developed. He is not toxic-appearing.  Cardiovascular:     Rate and Rhythm: Regular rhythm. Tachycardia present.     Heart sounds: Normal heart sounds.  Pulmonary:     Breath sounds: Wheezing present.  Musculoskeletal:        General: Normal range of motion.     Cervical back: Normal range of motion and neck supple.  Skin:    General: Skin is warm.  Neurological:     General: No focal deficit present.     Mental Status: He is alert.     UC Treatments / Results   Labs (all labs ordered are listed, but only abnormal results are displayed) Labs Reviewed - No data to display  EKG   Radiology DG Chest 2 View  Result Date: 04/15/2023 CLINICAL DATA:  Cough and shortness of breath. EXAM: CHEST - 2 VIEW COMPARISON:  Chest radiographs 04/26/2022 and 09/29/2021 FINDINGS: Cardiac silhouette and mediastinal contours are within normal limits. The lungs are clear. No pleural effusion or pneumothorax. Mild multilevel degenerative disc changes of the upper thoracic spine. IMPRESSION: No active cardiopulmonary disease. Electronically Signed   By: Neita Garnet M.D.   On: 04/15/2023 14:55    Procedures Procedures (including critical care time)  Medications Ordered in UC Medications  albuterol (PROVENTIL) (2.5 MG/3ML) 0.083% nebulizer solution 2.5 mg (has no administration in time range)  ipratropium-albuterol (DUONEB) 0.5-2.5 (3) MG/3ML nebulizer solution 3 mL (3 mLs Nebulization Given 04/15/23 1415)   After breathing treatment patient appears much calmer on exam;  less coughing, no wheezing noted;    Initial Impression / Assessment and Plan / UC Course  I have reviewed the triage vital signs and the nursing notes.  Pertinent labs & imaging results that were available during my care of the patient were reviewed by me and considered in my medical decision making (see chart for details).  Final Clinical Impressions(s) / UC Diagnoses   Final diagnoses:  Mild intermittent asthma with acute exacerbation     Discharge Instructions      You were seen today for asthma exacerbation.  Your chest xray was normal.  I have sent out a refill of your inhaler and a steroid pack to the pharmacy.  If you have continued or worsening symptoms then please return.  You need to establish with a primary care provider for better long term control of your asthma.  Please go to [email or web address] to find a provider in your area.     ED Prescriptions     Medication Sig  Dispense Auth. Provider   methylPREDNISolone (MEDROL DOSEPAK) 4 MG TBPK tablet Take as directed 1 each Jacqualin Shirkey, MD   albuterol (VENTOLIN HFA) 108 (90 Base) MCG/ACT inhaler Inhale 2 puffs into the lungs every 4 (  four) hours as needed for wheezing. 18 g Jannifer Franklin, MD      PDMP not reviewed this encounter.   Jannifer Franklin, MD 04/15/23 [phone]

## 2023-04-15 NOTE — Discharge Instructions (Addendum)
You were seen today for asthma exacerbation.  Your chest xray was normal.  I have sent out a refill of your inhaler and a steroid pack to the pharmacy.  If you have continued or worsening symptoms then please return.  You need to establish with a primary care provider for better long term control of your asthma.  Please go to [email or web address] to find a provider in your area.

## 2023-04-15 NOTE — ED Triage Notes (Signed)
Patient presenting with chest pain, SOB, and productive cough. Patient has history of asthma. Patient has tired his inhaler and nebulizer.   Patient has complaint of dizzy, lightheaded, SOB with sitting.   Provider called into triage as Patient coughing and SOB to the point of being unable to talk much. Verbal order given to administer oxygen via nasal cannula. Patient placed on 2L.

## 2023-07-11 ENCOUNTER — Ambulatory Visit (HOSPITAL_COMMUNITY)
Admission: EM | Admit: 2023-07-11 | Discharge: 2023-07-11 | Disposition: A | Discharge status: Home / Self Care | Payer: Medicaid Other | Attending: Physician Assistant | Admitting: Physician Assistant

## 2023-07-11 ENCOUNTER — Encounter (HOSPITAL_COMMUNITY): Payer: Self-pay | Admitting: Emergency Medicine

## 2023-07-11 DIAGNOSIS — J4541 Moderate persistent asthma with (acute) exacerbation: Secondary | ICD-10-CM

## 2023-07-11 DIAGNOSIS — R0602 Shortness of breath: Secondary | ICD-10-CM

## 2023-07-11 MED ORDER — ALBUTEROL SULFATE HFA 108 (90 BASE) MCG/ACT IN AERS: 2.0000 | INHALATION_SPRAY | RESPIRATORY_TRACT | 1 refills | Status: DC | PRN

## 2023-07-11 MED ORDER — BUDESONIDE-FORMOTEROL FUMARATE 80-4.5 MCG/ACT IN AERO: 2.0000 | INHALATION_SPRAY | Freq: Two times a day (BID) | RESPIRATORY_TRACT | 1 refills | Status: DC

## 2023-07-11 MED ORDER — PREDNISONE 10 MG (21) PO TBPK: ORAL_TABLET | ORAL | 0 refills | Status: DC

## 2023-07-11 MED ORDER — ALBUTEROL SULFATE (2.5 MG/3ML) 0.083% IN NEBU: 2.5000 mg | INHALATION_SOLUTION | Freq: Four times a day (QID) | RESPIRATORY_TRACT | 0 refills | Status: DC | PRN

## 2023-07-11 NOTE — Discharge Instructions (Signed)
We are treating you for an asthma exacerbation.  Please start prednisone as prescribed.  Do not take NSAIDs with this medication including aspirin, ibuprofen/Advil, naproxen/Aleve.  You can use Tylenol as needed.  Use albuterol every 4-6 hours as needed.  I have called in an albuterol inhaler to take with you when you are out and about as well as a nebulizer solution to use in your machine whenever you are at home.  These are the same medication and should not be used together.  Start Symbicort twice daily.  Make sure to rinse your mouth following use of this medication to prevent thrush.  If you have any persistent or worsening symptoms including chest pain, shortness of breath, nausea/vomiting, weakness you need to be seen immediately.

## 2023-07-11 NOTE — ED Triage Notes (Signed)
Patient c/o SOB x 2 days.  Patient states he lost his rescue inhaler and needs a refill.  Denies any other sx's.

## 2023-07-11 NOTE — ED Provider Notes (Signed)
[facility]    CSN: [id number] Arrival date & time: 07/11/23  8295      History   Chief Complaint Chief Complaint  Patient presents with   Shortness of Breath    HPI Thomas Houston is a 32 y.o. male.   Patient presents today with a 2-day history of recurrent shortness of breath and asthma symptoms.  Reports associated chest tightness, cough.  Denies any fever, congestion, nausea, vomiting.  Denies any known sick contacts or recent COVID infection.  He does have a history of asthma was last seen by our clinic in May 2024.  He has been using his albuterol inhaler regularly but recently lost this and so is requesting a refill.  He has a nebulizer machine at home but does not have any medication to put into this machine.  He has not taken a maintenance medication in the past.  Does report previous hospitalization as recently as 3 months ago.  Denies previous intubation related to asthma.  Last steroid use was several months ago.  He denies history of diabetes.  Denies any recent antibiotics.    Past Medical History:  Diagnosis Date   Asthma     There are no problems to display for this patient.   History reviewed. No pertinent surgical history.     Home Medications    Prior to Admission medications   Medication Sig Start Date End Date Taking? Authorizing Provider  albuterol (PROVENTIL) (2.5 MG/3ML) 0.083% nebulizer solution Take 3 mLs (2.5 mg total) by nebulization every 6 (six) hours as needed for wheezing or shortness of breath. 07/11/23  Yes ,  K, PA-C  budesonide-formoterol (SYMBICORT) 80-4.5 MCG/ACT inhaler Inhale 2 puffs into the lungs in the morning and at bedtime. 07/11/23  Yes ,  K, PA-C  predniSONE (STERAPRED UNI-PAK 21 TAB) 10 MG (21) TBPK tablet As directed 07/11/23  Yes ,  K, PA-C  albuterol (VENTOLIN HFA) 108 (90 Base) MCG/ACT inhaler Inhale 2 puffs into the lungs every 4 (four) hours as needed for wheezing. 07/11/23   , Noberto Retort, PA-C    Family History Family History  Problem Relation Age of Onset   Asthma Mother    Hypertension Mother     Social History Social History   Tobacco Use   Smoking status: Every Day    Current packs/day: 3.00    Types: Cigarettes   Smokeless tobacco: Never  Vaping Use   Vaping status: Never Used  Substance Use Topics   Alcohol use: No   Drug use: No     Allergies   Other   Review of Systems Review of Systems  Constitutional:  Positive for activity change. Negative for appetite change, fatigue and fever.  HENT:  Negative for congestion, sinus pressure, sneezing and sore throat.   Respiratory:  Positive for cough, chest tightness and shortness of breath. Negative for wheezing.   Cardiovascular:  Negative for chest pain.  Gastrointestinal:  Negative for abdominal pain, diarrhea, nausea and vomiting.     Physical Exam Triage Vital Signs ED Triage Vitals  Encounter Vitals Group     BP 07/11/23 1001 (!) 145/104     Systolic BP Percentile --      Diastolic BP Percentile --      Pulse Rate 07/11/23 1001 70     Resp 07/11/23 1001 18     Temp 07/11/23 1001 98.7 F (37.1 C)     Temp Source 07/11/23 1001 Oral     SpO2  07/11/23 1001 95 %     Weight 07/11/23 1003 195 lb (88.5 kg)     Height 07/11/23 1003 6' (1.829 m)     Head Circumference --      Peak Flow --      Pain Score 07/11/23 1003 0     Pain Loc --      Pain Education --      Exclude from Growth Chart --    No data found.  Updated Vital Signs BP (!) 145/104 (BP Location: Right Arm)   Pulse 70   Temp 98.7 F (37.1 C) (Oral)   Resp 18   Ht 6' (1.829 m)   Wt 195 lb (88.5 kg)   SpO2 95%   BMI 26.45 kg/m   Visual Acuity Right Eye Distance:   Left Eye Distance:   Bilateral Distance:    Right Eye Near:   Left Eye Near:    Bilateral Near:     Physical Exam Vitals reviewed.  Constitutional:      General: He is awake.     Appearance: Normal appearance. He is well-developed. He is not  ill-appearing.     Comments: Very pleasant male appears stated age in no acute distress sitting comfortably in exam room  HENT:     Head: Normocephalic and atraumatic.  Cardiovascular:     Rate and Rhythm: Normal rate and regular rhythm.     Heart sounds: Normal heart sounds, S1 normal and S2 normal. No murmur heard. Pulmonary:     Effort: Pulmonary effort is normal.     Breath sounds: No stridor. Examination of the right-lower field reveals decreased breath sounds. Examination of the left-lower field reveals decreased breath sounds. Decreased breath sounds present. No wheezing, rhonchi or rales.  Abdominal:     General: Bowel sounds are normal.     Palpations: Abdomen is soft.     Tenderness: There is no abdominal tenderness.  Neurological:     Mental Status: He is alert.  Psychiatric:        Behavior: Behavior is cooperative.      UC Treatments / Results  Labs (all labs ordered are listed, but only abnormal results are displayed) Labs Reviewed - No data to display  EKG   Radiology No results found.  Procedures Procedures (including critical care time)  Medications Ordered in UC Medications - No data to display  Initial Impression / Assessment and Plan / UC Course  I have reviewed the triage vital signs and the nursing notes.  Pertinent labs & imaging results that were available during my care of the patient were reviewed by me and considered in my medical decision making (see chart for details).     Patient is well-appearing, afebrile, nontoxic, nontachycardic.  No adventitious lung sounds on exam so x-ray was deferred.  Offered patient injection of steroids and DuoNeb in clinic given decreased aeration of bilateral bases but he declined this as he was in hurry to attend a job interview.  Will treat outpatient for asthma exacerbation.  Refill of albuterol nebulizer solution as well as inhaler were sent to pharmacy.  Will start prednisone taper and he was instructed not  to take NSAIDs with this medication due to risk of GI bleeding.  He has not taken a maintenance medication in the past so we will start Symbicort.  Discussed that he is to rinse his mouth following use of this medication to prevent thrush.  If his symptoms are not improving significantly within the  day he is to return for reevaluation.  Discussed that if he has any worsening symptoms including worsening cough, shortness of breath, chest tightness, fever, nausea, vomiting he needs to be seen immediately.  Strict return precautions given.  Final Clinical Impressions(s) / UC Diagnoses   Final diagnoses:  Moderate persistent asthma with acute exacerbation  Shortness of breath     Discharge Instructions      We are treating you for an asthma exacerbation.  Please start prednisone as prescribed.  Do not take NSAIDs with this medication including aspirin, ibuprofen/Advil, naproxen/Aleve.  You can use Tylenol as needed.  Use albuterol every 4-6 hours as needed.  I have called in an albuterol inhaler to take with you when you are out and about as well as a nebulizer solution to use in your machine whenever you are at home.  These are the same medication and should not be used together.  Start Symbicort twice daily.  Make sure to rinse your mouth following use of this medication to prevent thrush.  If you have any persistent or worsening symptoms including chest pain, shortness of breath, nausea/vomiting, weakness you need to be seen immediately.     ED Prescriptions     Medication Sig Dispense Auth. Provider   albuterol (VENTOLIN HFA) 108 (90 Base) MCG/ACT inhaler Inhale 2 puffs into the lungs every 4 (four) hours as needed for wheezing. 18 g ,  K, PA-C   predniSONE (STERAPRED UNI-PAK 21 TAB) 10 MG (21) TBPK tablet As directed 21 tablet ,  K, PA-C   budesonide-formoterol (SYMBICORT) 80-4.5 MCG/ACT inhaler Inhale 2 puffs into the lungs in the morning and at bedtime. 1 each ,   K, PA-C   albuterol (PROVENTIL) (2.5 MG/3ML) 0.083% nebulizer solution Take 3 mLs (2.5 mg total) by nebulization every 6 (six) hours as needed for wheezing or shortness of breath. 75 mL ,  K, PA-C      PDMP not reviewed this encounter.   Jeani Hawking, PA-C 07/11/23 1044

## 2023-11-10 ENCOUNTER — Ambulatory Visit (HOSPITAL_COMMUNITY)
Admission: EM | Admit: 2023-11-10 | Discharge: 2023-11-10 | Disposition: A | Discharge status: Home / Self Care | Payer: BLUE CROSS/BLUE SHIELD | Attending: Emergency Medicine | Admitting: Emergency Medicine

## 2023-11-10 ENCOUNTER — Encounter (HOSPITAL_COMMUNITY): Payer: Self-pay

## 2023-11-10 DIAGNOSIS — J4521 Mild intermittent asthma with (acute) exacerbation: Secondary | ICD-10-CM | POA: Diagnosis not present

## 2023-11-10 MED ORDER — ALBUTEROL SULFATE HFA 108 (90 BASE) MCG/ACT IN AERS
INHALATION_SPRAY | RESPIRATORY_TRACT | Status: AC
  Filled 2023-11-10: qty 6.7

## 2023-11-10 MED ORDER — PROMETHAZINE-DM 6.25-15 MG/5ML PO SYRP
5.0000 mL | ORAL_SOLUTION | Freq: Four times a day (QID) | ORAL | 0 refills | Status: DC | PRN
Start: 2023-11-10 — End: 2023-11-29

## 2023-11-10 MED ORDER — PREDNISONE 20 MG PO TABS: 40.0000 mg | ORAL_TABLET | Freq: Every day | ORAL | 0 refills | Status: AC

## 2023-11-10 MED ORDER — ALBUTEROL SULFATE HFA 108 (90 BASE) MCG/ACT IN AERS
2.0000 | INHALATION_SPRAY | Freq: Once | RESPIRATORY_TRACT | Status: AC
  Administered 2023-11-10: 2 via RESPIRATORY_TRACT

## 2023-11-10 NOTE — ED Triage Notes (Signed)
Pt presents to the office for asthma flare-up on SOB x 1 week. Pt states he has been out of his Albuterol for a week.

## 2023-11-10 NOTE — ED Provider Notes (Signed)
[facility]    CSN: [id number] Arrival date & time: 11/10/23  1258      History   Chief Complaint No chief complaint on file.   HPI Thomas Houston is a 32 y.o. male.   Patient presents to clinic complaining of nonproductive cough, shortness of breath and wheezing for the past week.  He has also been out of his albuterol inhaler for the past week.  He does not have a primary care provider.  He has not had any fevers, no fatigue, no sore throat or congestion.  No recent sick contacts.  The history is provided by the patient and medical records.    Past Medical History:  Diagnosis Date   Asthma     There are no problems to display for this patient.   History reviewed. No pertinent surgical history.     Home Medications    Prior to Admission medications   Medication Sig Start Date End Date Taking? Authorizing Provider  albuterol (PROVENTIL) (2.5 MG/3ML) 0.083% nebulizer solution Take 3 mLs (2.5 mg total) by nebulization every 6 (six) hours as needed for wheezing or shortness of breath. 07/11/23  Yes Raspet, Erin K, PA-C  albuterol (VENTOLIN HFA) 108 (90 Base) MCG/ACT inhaler Inhale 2 puffs into the lungs every 4 (four) hours as needed for wheezing. 07/11/23  Yes Raspet, Erin K, PA-C  budesonide-formoterol (SYMBICORT) 80-4.5 MCG/ACT inhaler Inhale 2 puffs into the lungs in the morning and at bedtime. 07/11/23  Yes Raspet, Erin K, PA-C  predniSONE (DELTASONE) 20 MG tablet Take 2 tablets (40 mg total) by mouth daily for 5 days. 11/10/23 11/15/23 Yes Rinaldo Ratel, [place] N, FNP  promethazine-dextromethorphan (PROMETHAZINE-DM) 6.25-15 MG/5ML syrup Take 5 mLs by mouth 4 (four) times daily as needed for cough. 11/10/23  Yes Camauri Craton, [place] N, FNP    Family History Family History  Problem Relation Age of Onset   Asthma Mother    Hypertension Mother     Social History Social History   Tobacco Use   Smoking status: Every Day    Current packs/day: 3.00    Types:  Cigarettes   Smokeless tobacco: Never  Vaping Use   Vaping status: Never Used  Substance Use Topics   Alcohol use: No   Drug use: No     Allergies   Other   Review of Systems Review of Systems  Per HPI   Physical Exam Triage Vital Signs ED Triage Vitals  Encounter Vitals Group     BP 11/10/23 1607 (!) 176/84     Systolic BP Percentile --      Diastolic BP Percentile --      Pulse Rate 11/10/23 1607 78     Resp 11/10/23 1607 18     Temp 11/10/23 1607 98.5 F (36.9 C)     Temp Source 11/10/23 1607 Oral     SpO2 11/10/23 1607 95 %     Weight --      Height --      Head Circumference --      Peak Flow --      Pain Score 11/10/23 1611 0     Pain Loc --      Pain Education --      Exclude from Growth Chart --    No data found.  Updated Vital Signs BP (!) 176/84 (BP Location: Left Arm)   Pulse 78   Temp 98.5 F (36.9 C) (Oral)   Resp 18   SpO2 95%  Visual Acuity Right Eye Distance:   Left Eye Distance:   Bilateral Distance:    Right Eye Near:   Left Eye Near:    Bilateral Near:     Physical Exam Vitals and nursing note reviewed.  Constitutional:      Appearance: Normal appearance.  HENT:     Head: Normocephalic and atraumatic.     Right Ear: External ear normal.     Left Ear: External ear normal.     Nose: Nose normal.     Mouth/Throat:     Mouth: Mucous membranes are moist.  Eyes:     Conjunctiva/sclera: Conjunctivae normal.  Cardiovascular:     Rate and Rhythm: Normal rate and regular rhythm.     Heart sounds: Normal heart sounds. No murmur heard. Pulmonary:     Effort: Pulmonary effort is normal.     Breath sounds: Wheezing present.  Musculoskeletal:        General: Normal range of motion.  Skin:    General: Skin is warm and dry.  Neurological:     General: No focal deficit present.     Mental Status: He is alert and oriented to person, place, and time.  Psychiatric:        Mood and Affect: Mood normal.        Behavior: Behavior  normal.      UC Treatments / Results  Labs (all labs ordered are listed, but only abnormal results are displayed) Labs Reviewed - No data to display  EKG   Radiology No results found.  Procedures Procedures (including critical care time)  Medications Ordered in UC Medications  albuterol (VENTOLIN HFA) 108 (90 Base) MCG/ACT inhaler 2 puff (has no administration in time range)    Initial Impression / Assessment and Plan / UC Course  I have reviewed the triage vital signs and the nursing notes.  Pertinent labs & imaging results that were available during my care of the patient were reviewed by me and considered in my medical decision making (see chart for details).  Vitals and triage reviewed, patient is hemodynamically stable.  Heart with regular rate and rhythm, lungs with some expiratory wheezing in the lower lobes and overall diminished air movement.  Oxygenation 95% on room air, able to speak in full sentences.  Provided with albuterol inhaler in clinic prior to leaving.  Patient does have a refill of his Symbicort and albuterol inhaler at his pharmacy.  Will start steroid burst for asthma exacerbation.  Cough management discussed.  Plan of care, follow-up care and return precautions given, no questions at this time.  Work note provided.    Final Clinical Impressions(s) / UC Diagnoses   Final diagnoses:  Mild intermittent asthma with acute exacerbation     Discharge Instructions      You appear to be having an exacerbation of your asthma.  You can use the albuterol inhaler every 6 hours as needed.  It looks like you also have a refill of this inhaler at your pharmacy, he also have a refill of the Symbicort available.  Take the cough medicine as needed.  Take the steroids with food and starting tomorrow you can take them with breakfast.  Please return to clinic if you have no improvement in her shortness of breath or wheezing.  Return to clinic if you develop fever  fatigue.  It is important that you follow-up with a primary care provider so they can manage your chronic healthcare conditions, such as your asthma.  ED Prescriptions     Medication Sig Dispense Auth. Provider   predniSONE (DELTASONE) 20 MG tablet Take 2 tablets (40 mg total) by mouth daily for 5 days. 10 tablet Rinaldo Ratel, [place] N, [place]   promethazine-dextromethorphan (PROMETHAZINE-DM) 6.25-15 MG/5ML syrup Take 5 mLs by mouth 4 (four) times daily as needed for cough. 118 mL Allisyn Kunz, [place] N, [place]      PDMP not reviewed this encounter.   Saafir Abdullah, [place] N, [place] 11/10/23 1626

## 2023-11-10 NOTE — Discharge Instructions (Signed)
You appear to be having an exacerbation of your asthma.  You can use the albuterol inhaler every 6 hours as needed.  It looks like you also have a refill of this inhaler at your pharmacy, he also have a refill of the Symbicort available.  Take the cough medicine as needed.  Take the steroids with food and starting tomorrow you can take them with breakfast.  Please return to clinic if you have no improvement in her shortness of breath or wheezing.  Return to clinic if you develop fever fatigue.  It is important that you follow-up with a primary care provider so they can manage your chronic healthcare conditions, such as your asthma.

## 2023-11-29 ENCOUNTER — Other Ambulatory Visit: Payer: Self-pay

## 2023-11-29 ENCOUNTER — Encounter (HOSPITAL_COMMUNITY): Payer: Self-pay | Admitting: *Deleted

## 2023-11-29 ENCOUNTER — Ambulatory Visit (HOSPITAL_COMMUNITY)
Admission: EM | Admit: 2023-11-29 | Discharge: 2023-11-29 | Disposition: A | Discharge status: Home / Self Care | Payer: BLUE CROSS/BLUE SHIELD | Attending: Emergency Medicine | Admitting: Emergency Medicine

## 2023-11-29 DIAGNOSIS — J4541 Moderate persistent asthma with (acute) exacerbation: Secondary | ICD-10-CM | POA: Diagnosis not present

## 2023-11-29 DIAGNOSIS — Z760 Encounter for issue of repeat prescription: Secondary | ICD-10-CM

## 2023-11-29 DIAGNOSIS — R030 Elevated blood-pressure reading, without diagnosis of hypertension: Secondary | ICD-10-CM

## 2023-11-29 MED ORDER — IPRATROPIUM-ALBUTEROL 0.5-2.5 (3) MG/3ML IN SOLN
3.0000 mL | Freq: Once | RESPIRATORY_TRACT | Status: AC
  Administered 2023-11-29: 3 mL via RESPIRATORY_TRACT

## 2023-11-29 MED ORDER — BUDESONIDE-FORMOTEROL FUMARATE 80-4.5 MCG/ACT IN AERO: 2.0000 | INHALATION_SPRAY | Freq: Two times a day (BID) | RESPIRATORY_TRACT | 6 refills | Status: DC

## 2023-11-29 MED ORDER — ALBUTEROL SULFATE HFA 108 (90 BASE) MCG/ACT IN AERS: 2.0000 | INHALATION_SPRAY | RESPIRATORY_TRACT | 6 refills | Status: DC | PRN

## 2023-11-29 MED ORDER — AZITHROMYCIN 250 MG PO TABS: 250.0000 mg | ORAL_TABLET | Freq: Every day | ORAL | 0 refills | Status: DC

## 2023-11-29 MED ORDER — PREDNISONE 10 MG PO TABS: ORAL_TABLET | ORAL | 0 refills | Status: AC

## 2023-11-29 MED ORDER — IPRATROPIUM-ALBUTEROL 0.5-2.5 (3) MG/3ML IN SOLN
RESPIRATORY_TRACT | Status: AC
  Filled 2023-11-29: qty 3

## 2023-11-29 MED ORDER — IPRATROPIUM-ALBUTEROL 0.5-2.5 (3) MG/3ML IN SOLN: 3.0000 mL | Freq: Four times a day (QID) | RESPIRATORY_TRACT | 1 refills | Status: DC | PRN

## 2023-11-29 NOTE — ED Triage Notes (Signed)
Pt reports he lost the Albuterol pump he received on 11-10-23 and has had wheezing. Pt needs another pump.

## 2023-11-29 NOTE — ED Provider Notes (Signed)
[facility]    CSN: [id number] Arrival date & time: 11/29/23  1010      History   Chief Complaint Chief Complaint  Patient presents with   Medication Refill    HPI Thomas Houston is a 32 y.o. male.   Patient reported loss of inhaler for asthma the day before Christmas eve.  He is having increased chest tightness cough worse at night, audible wheezing.  He does not currently have a PCP  The history is provided by the patient.  Medication Refill   Past Medical History:  Diagnosis Date   Asthma     There are no active problems to display for this patient.   History reviewed. No pertinent surgical history.     Home Medications    Prior to Admission medications   Medication Sig Start Date End Date Taking? Authorizing Provider  azithromycin (ZITHROMAX) 250 MG tablet Take 1 tablet (250 mg total) by mouth daily. Take first 2 tablets together, then 1 every day until finished. 11/29/23  Yes Meshulem Onorato, Linde Gillis, NP  ipratropium-albuterol (DUONEB) 0.5-2.5 (3) MG/3ML SOLN Take 3 mLs by nebulization every 6 (six) hours as needed (shortness of breath). 11/29/23  Yes Richar Dunklee, Linde Gillis, NP  predniSONE (DELTASONE) 10 MG tablet Take 4 tablets (40 mg total) by mouth daily with breakfast for 2 days, THEN 3 tablets (30 mg total) daily with breakfast for 2 days, THEN 2 tablets (20 mg total) daily with breakfast for 2 days, THEN 1 tablet (10 mg total) daily with breakfast for 2 days, THEN 0.5 tablets (5 mg total) daily with breakfast for 2 days. 11/29/23 12/09/23 Yes Layaan Mott, Linde Gillis, NP  albuterol (VENTOLIN HFA) 108 (90 Base) MCG/ACT inhaler Inhale 2 puffs into the lungs every 4 (four) hours as needed for wheezing. 11/29/23   Terrye Dombrosky, Linde Gillis, NP  budesonide-formoterol (SYMBICORT) 80-4.5 MCG/ACT inhaler Inhale 2 puffs into the lungs in the morning and at bedtime. 11/29/23   Tekia Waterbury, Linde Gillis, NP    Family History Family History  Problem Relation Age of Onset   Asthma Mother     Hypertension Mother     Social History Social History   Tobacco Use   Smoking status: Every Day    Current packs/day: 3.00    Types: Cigarettes   Smokeless tobacco: Never  Vaping Use   Vaping status: Never Used  Substance Use Topics   Alcohol use: No   Drug use: No     Allergies   Other   Review of Systems Review of Systems  Constitutional: Negative.   Respiratory:  Positive for cough, chest tightness, shortness of breath and wheezing.   All other systems reviewed and are negative.    Physical Exam Triage Vital Signs ED Triage Vitals  Encounter Vitals Group     BP 11/29/23 1115 (!) 154/106     Systolic BP Percentile --      Diastolic BP Percentile --      Pulse Rate 11/29/23 1115 69     Resp 11/29/23 1115 16     Temp 11/29/23 1115 98.1 F (36.7 C)     Temp src --      SpO2 11/29/23 1115 100 %     Weight --      Height --      Head Circumference --      Peak Flow --      Pain Score 11/29/23 1112 0     Pain Loc --  Pain Education --      Exclude from Growth Chart --    No data found.  Updated Vital Signs BP (!) 143/87   Pulse 71   Temp 98 F (36.7 C)   Resp 20   SpO2 97%   Visual Acuity Right Eye Distance:   Left Eye Distance:   Bilateral Distance:    Right Eye Near:   Left Eye Near:    Bilateral Near:     Physical Exam Cardiovascular:     Rate and Rhythm: Normal rate and regular rhythm.     Heart sounds: Normal heart sounds.  Pulmonary:     Breath sounds: Wheezing present.     Comments: Wheezing in all fields.  Diminished bases. Neurological:     General: No focal deficit present.     Mental Status: He is alert.      UC Treatments / Results  Labs (all labs ordered are listed, but only abnormal results are displayed) Labs Reviewed - No data to display  EKG   Radiology No results found.  Procedures Procedures (including critical care time)  Medications Ordered in UC Medications  ipratropium-albuterol (DUONEB)  0.5-2.5 (3) MG/3ML nebulizer solution 3 mL (3 mLs Nebulization Given 11/29/23 1213)    Initial Impression / Assessment and Plan / UC Course  I have reviewed the triage vital signs and the nursing notes.  Pertinent labs & imaging results that were available during my care of the patient were reviewed by me and considered in my medical decision making (see chart for details).   Patient with asthma exacerbation.  He has not had his inhaler since Christmas eve.  He is out of his DuoNeb nebulizer medication.  On exam he has decreased air movement with audible wheezing worse in the mid and upper lobes.  Breathing treatment given in office.  With improvement noted on auscultation of air movement.  He does not currently have a PCP for management. Noted to have hypertension 154/106 he does not have a history of hypertension recheck blood pressure.  Elevated blood pressure was noted to be 143/87. We will assist him to find a PCP,  education given on blood pressure management  Final Clinical Impressions(s) / UC Diagnoses   Final diagnoses:  Moderate persistent asthma with acute exacerbation  Medication refill  Elevated blood pressure reading in office without diagnosis of hypertension   Discharge Instructions   None    ED Prescriptions     Medication Sig Dispense Auth. Provider   budesonide-formoterol (SYMBICORT) 80-4.5 MCG/ACT inhaler Inhale 2 puffs into the lungs in the morning and at bedtime. 10.2 g Boby Eyer M, NP   albuterol (VENTOLIN HFA) 108 (90 Base) MCG/ACT inhaler Inhale 2 puffs into the lungs every 4 (four) hours as needed for wheezing. 18 g Channon Brougher M, NP   azithromycin (ZITHROMAX) 250 MG tablet Take 1 tablet (250 mg total) by mouth daily. Take first 2 tablets together, then 1 every day until finished. 6 tablet Akili Cuda, Linde Gillis, NP   predniSONE (DELTASONE) 10 MG tablet Take 4 tablets (40 mg total) by mouth daily with breakfast for 2 days, THEN 3 tablets (30 mg total) daily  with breakfast for 2 days, THEN 2 tablets (20 mg total) daily with breakfast for 2 days, THEN 1 tablet (10 mg total) daily with breakfast for 2 days, THEN 0.5 tablets (5 mg total) daily with breakfast for 2 days. 21 tablet [place] Chelf, Linde Gillis, NP   ipratropium-albuterol (DUONEB) 0.5-2.5 (3) MG/3ML SOLN  Take 3 mLs by nebulization every 6 (six) hours as needed (shortness of breath). 360 mL Kareem Aul, Linde Gillis, NP      PDMP not reviewed this encounter.   Nelda Marseille, NP 11/29/23 [phone]

## 2023-12-01 ENCOUNTER — Ambulatory Visit: Payer: BLUE CROSS/BLUE SHIELD | Admitting: Family Medicine

## 2023-12-12 ENCOUNTER — Ambulatory Visit (HOSPITAL_COMMUNITY)
Admission: EM | Admit: 2023-12-12 | Discharge: 2023-12-12 | Disposition: A | Discharge status: Home / Self Care | Payer: BLUE CROSS/BLUE SHIELD | Attending: Family Medicine | Admitting: Family Medicine

## 2023-12-12 ENCOUNTER — Encounter (HOSPITAL_COMMUNITY): Payer: Self-pay

## 2023-12-12 ENCOUNTER — Ambulatory Visit (INDEPENDENT_AMBULATORY_CARE_PROVIDER_SITE_OTHER): Payer: BLUE CROSS/BLUE SHIELD

## 2023-12-12 DIAGNOSIS — J4521 Mild intermittent asthma with (acute) exacerbation: Secondary | ICD-10-CM | POA: Diagnosis not present

## 2023-12-12 DIAGNOSIS — R051 Acute cough: Secondary | ICD-10-CM

## 2023-12-12 LAB — POCT INFLUENZA A/B
Influenza A, POC: NEGATIVE
Influenza B, POC: NEGATIVE

## 2023-12-12 MED ORDER — ALBUTEROL SULFATE (2.5 MG/3ML) 0.083% IN NEBU
2.5000 mg | INHALATION_SOLUTION | Freq: Once | RESPIRATORY_TRACT | Status: AC
  Administered 2023-12-12: 2.5 mg via RESPIRATORY_TRACT

## 2023-12-12 MED ORDER — PREDNISONE 50 MG PO TABS: 50.0000 mg | ORAL_TABLET | Freq: Every day | ORAL | 0 refills | Status: AC

## 2023-12-12 MED ORDER — ALBUTEROL SULFATE (2.5 MG/3ML) 0.083% IN NEBU
INHALATION_SOLUTION | RESPIRATORY_TRACT | Status: AC
  Filled 2023-12-12: qty 3

## 2023-12-12 MED ORDER — ALBUTEROL SULFATE HFA 108 (90 BASE) MCG/ACT IN AERS: 2.0000 | INHALATION_SPRAY | RESPIRATORY_TRACT | 6 refills | Status: DC | PRN

## 2023-12-12 NOTE — Discharge Instructions (Signed)
 You were seen today for an asthma exacerbation.  Your xray was normal.  Your flu swab was negative.  I have sent out a script for a steroid, as well as a refill of your albuterol HFA for your symptoms.  Please follow up or return if not improving.

## 2023-12-12 NOTE — ED Triage Notes (Signed)
 Pt c/o dry cough with chest congestion x2 days. States used his inhaler and neb tx with little relief. Took ibuprofen and mucinex with no relief.

## 2023-12-12 NOTE — ED Provider Notes (Signed)
 [facility]    CSN: [id number] Arrival date & time: 12/12/23  9166      History   Chief Complaint Chief Complaint  Patient presents with   Cough    HPI Thomas Houston is a 33 y.o. male.    Cough Associated symptoms: shortness of breath and wheezing   Associated symptoms: no chills and no fever    Patient is here for cough, chest congestion x 2 days.  No fevers.  No runny nose, congestion, but mostly cough.  Some wheezing, sob.  Using home inhalers without  much help.  No known sick contacts.        Past Medical History:  Diagnosis Date   Asthma     There are no active problems to display for this patient.   History reviewed. No pertinent surgical history.     Home Medications    Prior to Admission medications   Medication Sig Start Date End Date Taking? Authorizing Provider  albuterol  (VENTOLIN  HFA) 108 (90 Base) MCG/ACT inhaler Inhale 2 puffs into the lungs every 4 (four) hours as needed for wheezing. 11/29/23   Blitch, Marval HERO, NP  budesonide -formoterol  (SYMBICORT ) 80-4.5 MCG/ACT inhaler Inhale 2 puffs into the lungs in the morning and at bedtime. 11/29/23   Blitch, Marval HERO, NP  ipratropium-albuterol  (DUONEB) 0.5-2.5 (3) MG/3ML SOLN Take 3 mLs by nebulization every 6 (six) hours as needed (shortness of breath). 11/29/23   Blitch, Marval HERO, NP    Family History Family History  Problem Relation Age of Onset   Asthma Mother    Hypertension Mother     Social History Social History   Tobacco Use   Smoking status: Every Day    Current packs/day: 3.00    Types: Cigarettes   Smokeless tobacco: Never  Vaping Use   Vaping status: Never Used  Substance Use Topics   Alcohol use: No   Drug use: No     Allergies   Other   Review of Systems Review of Systems  Constitutional:  Positive for fatigue. Negative for chills and fever.  HENT: Negative.    Respiratory:  Positive for cough, shortness of breath and wheezing.   Cardiovascular:  Negative.   Gastrointestinal: Negative.   Genitourinary: Negative.   Musculoskeletal: Negative.   Psychiatric/Behavioral: Negative.       Physical Exam Triage Vital Signs ED Triage Vitals [12/12/23 0929]  Encounter Vitals Group     BP (!) 157/78     Systolic BP Percentile      Diastolic BP Percentile      Pulse Rate (!) 111     Resp 18     Temp 99.4 F (37.4 C)     Temp Source Oral     SpO2 95 %     Weight      Height      Head Circumference      Peak Flow      Pain Score 8     Pain Loc      Pain Education      Exclude from Growth Chart    No data found.  Updated Vital Signs BP (!) 157/78 (BP Location: Left Arm)   Pulse (!) 111   Temp 99.4 F (37.4 C) (Oral)   Resp 18   SpO2 95%   Visual Acuity Right Eye Distance:   Left Eye Distance:   Bilateral Distance:    Right Eye Near:   Left Eye Near:    Bilateral Near:  Physical Exam Constitutional:      General: He is not in acute distress.    Appearance: Normal appearance. He is not ill-appearing.     Comments: Continuous harsh cough throughout the exam  HENT:     Nose: Nose normal.  Cardiovascular:     Rate and Rhythm: Normal rate and regular rhythm.  Pulmonary:     Effort: Pulmonary effort is normal.     Breath sounds: Wheezing present.  Musculoskeletal:     Cervical back: Normal range of motion and neck supple.  Skin:    General: Skin is warm.  Neurological:     General: No focal deficit present.     Mental Status: He is alert.  Psychiatric:        Mood and Affect: Mood normal.     UC Treatments / Results  Labs (all labs ordered are listed, but only abnormal results are displayed) Labs Reviewed  POCT INFLUENZA A/B    EKG   Radiology DG Chest 2 View Result Date: 12/12/2023 CLINICAL DATA:  Cough EXAM: CHEST - 2 VIEW COMPARISON:  Chest radiograph 04/15/2023 FINDINGS: Normal cardiac and mediastinal contours. No large area pulmonary consolidation. No pleural effusion or pneumothorax.  Osseous structures unremarkable. Suspect artifact overlying the upper lungs bilaterally. IMPRESSION: No active cardiopulmonary disease. Electronically Signed   By: Bard Moats M.D.   On: 12/12/2023 09:53    Procedures Procedures (including critical care time)  Medications Ordered in UC Medications  albuterol  (PROVENTIL ) (2.5 MG/3ML) 0.083% nebulizer solution 2.5 mg (has no administration in time range)    Initial Impression / Assessment and Plan / UC Course  I have reviewed the triage vital signs and the nursing notes.  Pertinent labs & imaging results that were available during my care of the patient were reviewed by me and considered in my medical decision making (see chart for details).    Final Clinical Impressions(s) / UC Diagnoses   Final diagnoses:  Acute cough  Mild intermittent asthma with exacerbation     Discharge Instructions      You were seen today for an asthma exacerbation.  Your xray was normal.  Your flu swab was negative.  I have sent out a script for a steroid, as well as a refill of your albuterol  HFA for your symptoms.  Please follow up or return if not improving.     ED Prescriptions     Medication Sig Dispense Auth. Provider   predniSONE  (DELTASONE ) 50 MG tablet Take 1 tablet (50 mg total) by mouth daily for 5 days. 5 tablet Oliviarose Punch, MD   albuterol  (VENTOLIN  HFA) 108 (90 Base) MCG/ACT inhaler Inhale 2 puffs into the lungs every 4 (four) hours as needed for wheezing. 18 g Darral Longs, MD      PDMP not reviewed this encounter.   Darral Longs, MD 12/12/23 [phone]

## 2024-04-23 ENCOUNTER — Ambulatory Visit (HOSPITAL_COMMUNITY)
Admission: EM | Admit: 2024-04-23 | Discharge: 2024-04-23 | Disposition: A | Discharge status: Home / Self Care | Attending: Family Medicine | Admitting: Family Medicine

## 2024-04-23 ENCOUNTER — Other Ambulatory Visit: Payer: Self-pay

## 2024-04-23 ENCOUNTER — Encounter (HOSPITAL_COMMUNITY): Payer: Self-pay | Admitting: *Deleted

## 2024-04-23 DIAGNOSIS — J4541 Moderate persistent asthma with (acute) exacerbation: Secondary | ICD-10-CM

## 2024-04-23 MED ORDER — NEBULIZER MASK ADULT/TUBING MISC: 1.0000 | 2 refills | Status: AC | PRN

## 2024-04-23 MED ORDER — ALBUTEROL SULFATE HFA 108 (90 BASE) MCG/ACT IN AERS
2.0000 | INHALATION_SPRAY | Freq: Once | RESPIRATORY_TRACT | Status: AC
  Administered 2024-04-23: 2 via RESPIRATORY_TRACT

## 2024-04-23 MED ORDER — AEROCHAMBER PLUS FLO-VU LARGE MISC
Status: AC
  Filled 2024-04-23: qty 1

## 2024-04-23 MED ORDER — ALBUTEROL SULFATE HFA 108 (90 BASE) MCG/ACT IN AERS
INHALATION_SPRAY | RESPIRATORY_TRACT | Status: AC
  Filled 2024-04-23: qty 6.7

## 2024-04-23 MED ORDER — AIRSUPRA 90-80 MCG/ACT IN AERO: 2.0000 | INHALATION_SPRAY | RESPIRATORY_TRACT | 0 refills | Status: AC | PRN

## 2024-04-23 MED ORDER — IPRATROPIUM-ALBUTEROL 0.5-2.5 (3) MG/3ML IN SOLN: 3.0000 mL | Freq: Four times a day (QID) | RESPIRATORY_TRACT | 0 refills | Status: AC | PRN

## 2024-04-23 MED ORDER — AEROCHAMBER PLUS FLO-VU MEDIUM MISC
1.0000 | Freq: Once | Status: AC
  Administered 2024-04-23: 1

## 2024-04-23 MED ORDER — BUDESONIDE-FORMOTEROL FUMARATE 80-4.5 MCG/ACT IN AERO: 2.0000 | INHALATION_SPRAY | Freq: Two times a day (BID) | RESPIRATORY_TRACT | 0 refills | Status: AC

## 2024-04-23 NOTE — ED Triage Notes (Signed)
 C/O asthma flare-up with slight cough x 1 wk. States is out of inhalers and has been borrowing one from friend. Denies any nasal congestion or fevers.

## 2024-04-23 NOTE — Discharge Instructions (Addendum)
 Asthma exacerbation: Patient ran out of all of his inhalers and did not have nebulizer solution or tubing.  He has been having coughing and some wheezing for about a week.  Breath sounds and oxygen saturation improved significantly with albuterol  inhaler use here in the clinic.  He was given a spacer with the albuterol  inhaler and instructed by the nurse on how to use it.  He had not used a spacer before and felt like it was helpful.  Refilled Symbicort , albuterol  inhaler, DuoNeb nebulizer solution and nebulizer tubing.  Patient already uses these and knows how to use them.  Will get the patient set up with primary care.  He would benefit potentially from maybe asthma and allergy blood testing or referral to an asthma and allergy specialist.  He may benefit from regular use during allergy season in the spring and fall of cetirizine/Zyrtec or loratadine/Claritin and possibly even montelukast/Singulair.  Follow-up here if symptoms do not improve, worsen or new symptoms occur.

## 2024-04-23 NOTE — ED Provider Notes (Signed)
 [facility]    CSN: [id number] Arrival date & time: 04/23/24  0803      History   Chief Complaint Chief Complaint  Patient presents with   Wheezing    HPI Thomas Houston is a 33 y.o. male.   33 year old male with long history of asthma.  Reports that he ran out of his albuterol  inhaler at home.  He has a nebulizer machine, but he does not have tubing that works and is out of medicine for his nebulizer machine.  He also ran out of his Symbicort  inhaler.  He has had a dry cough since 04/16/2024 or earlier.  He has had minimal head congestion.  He denies fever, nausea, vomiting, constipation, diarrhea.  He feels like this is an asthma and even seasonal allergy flareup.  He hopes to get his inhalers refilled.   Wheezing Associated symptoms: shortness of breath   Associated symptoms: no chest pain, no cough, no ear pain, no fever, no rash and no sore throat     Past Medical History:  Diagnosis Date   Asthma     There are no active problems to display for this patient.   History reviewed. No pertinent surgical history.     Home Medications    Prior to Admission medications   Medication Sig Start Date End Date Taking? Authorizing Provider  Albuterol -Budesonide  (AIRSUPRA) 90-80 MCG/ACT AERO Inhale 2 puffs into the lungs every 4 (four) hours as needed. 04/23/24  Yes Guss Legacy, FNP  Respiratory Therapy Supplies (NEBULIZER MASK ADULT/TUBING) MISC 1 each by Does not apply route every 4 (four) hours as needed. 04/23/24  Yes Guss Legacy, FNP  albuterol  (VENTOLIN  HFA) 108 (90 Base) MCG/ACT inhaler Inhale 2 puffs into the lungs every 4 (four) hours as needed for wheezing. 12/12/23   Piontek, Cleveland Dales, MD  budesonide -formoterol  (SYMBICORT ) 80-4.5 MCG/ACT inhaler Inhale 2 puffs into the lungs in the morning and at bedtime. 04/23/24   Guss Legacy, FNP  ipratropium-albuterol  (DUONEB) 0.5-2.5 (3) MG/3ML SOLN Take 3 mLs by nebulization every 6 (six) hours as needed (shortness of  breath). 04/23/24   Guss Legacy, FNP    Family History Family History  Problem Relation Age of Onset   Asthma Mother    Hypertension Mother     Social History Social History   Tobacco Use   Smoking status: Former    Current packs/day: 3.00    Types: Cigarettes    Passive exposure: Current   Smokeless tobacco: Never  Vaping Use   Vaping status: Never Used  Substance Use Topics   Alcohol use: No   Drug use: No     Allergies   Other   Review of Systems Review of Systems  Constitutional:  Negative for chills and fever.  HENT:  Negative for ear pain and sore throat.   Eyes:  Negative for pain and visual disturbance.  Respiratory:  Positive for shortness of breath and wheezing. Negative for cough.   Cardiovascular:  Negative for chest pain and palpitations.  Gastrointestinal:  Negative for abdominal pain, constipation, diarrhea, nausea and vomiting.  Genitourinary:  Negative for dysuria and hematuria.  Musculoskeletal:  Negative for arthralgias and back pain.  Skin:  Negative for color change and rash.  Neurological:  Negative for seizures and syncope.  All other systems reviewed and are negative.    Physical Exam Triage Vital Signs ED Triage Vitals  Encounter Vitals Group     BP 04/23/24 0819 (!) 158/101     Systolic BP  Percentile --      Diastolic BP Percentile --      Pulse Rate 04/23/24 0819 68     Resp 04/23/24 0819 16     Temp 04/23/24 0819 98.2 F (36.8 C)     Temp Source 04/23/24 0819 Oral     SpO2 04/23/24 0819 94 %     Weight --      Height --      Head Circumference --      Peak Flow --      Pain Score 04/23/24 0821 0     Pain Loc --      Pain Education --      Exclude from Growth Chart --    No data found.  Updated Vital Signs BP (!) 158/101   Pulse 68   Temp 98.2 F (36.8 C) (Oral)   Resp 16   SpO2 97%   Visual Acuity Right Eye Distance:   Left Eye Distance:   Bilateral Distance:    Right Eye Near:   Left Eye Near:     Bilateral Near:     Physical Exam Vitals and nursing note reviewed.  Constitutional:      General: He is not in acute distress.    Appearance: He is well-developed. He is not ill-appearing or toxic-appearing.  HENT:     Head: Normocephalic and atraumatic.     Right Ear: Hearing, tympanic membrane, ear canal and external ear normal.     Left Ear: Hearing, tympanic membrane, ear canal and external ear normal.     Nose: No congestion or rhinorrhea.     Right Sinus: No maxillary sinus tenderness or frontal sinus tenderness.     Left Sinus: No maxillary sinus tenderness or frontal sinus tenderness.     Mouth/Throat:     Lips: Pink.     Mouth: Mucous membranes are moist.     Pharynx: Uvula midline. No oropharyngeal exudate or posterior oropharyngeal erythema.     Tonsils: No tonsillar exudate.  Eyes:     Conjunctiva/sclera: Conjunctivae normal.     Pupils: Pupils are equal, round, and reactive to light.  Cardiovascular:     Rate and Rhythm: Normal rate and regular rhythm.     Heart sounds: S1 normal and S2 normal. No murmur heard. Pulmonary:     Effort: Pulmonary effort is normal. No respiratory distress.     Breath sounds: Examination of the right-upper field reveals wheezing. Examination of the left-upper field reveals wheezing. Examination of the right-lower field reveals decreased breath sounds. Examination of the left-lower field reveals decreased breath sounds. Decreased breath sounds and wheezing present. No rhonchi or rales.     Comments: Reassessment after albuterol  inhaler dosing: Wheezing resolved and breath sounds were significantly improved and audible throughout after 2 puffs of his albuterol  inhaler with a spacer.  His oxygen saturation went from 94% to 9697% on room air. Abdominal:     General: Bowel sounds are normal.     Palpations: Abdomen is soft.     Tenderness: There is no abdominal tenderness.  Musculoskeletal:        General: No swelling.     Cervical back: Neck  supple.  Lymphadenopathy:     Head:     Right side of head: No submental, submandibular, tonsillar, preauricular or posterior auricular adenopathy.     Left side of head: No submental, submandibular, tonsillar, preauricular or posterior auricular adenopathy.     Cervical: No cervical adenopathy.  Right cervical: No superficial cervical adenopathy.    Left cervical: No superficial cervical adenopathy.  Skin:    General: Skin is warm and dry.     Capillary Refill: Capillary refill takes less than 2 seconds.     Findings: No rash.  Neurological:     Mental Status: He is alert and oriented to person, place, and time.  Psychiatric:        Mood and Affect: Mood normal.      UC Treatments / Results  Labs (all labs ordered are listed, but only abnormal results are displayed) Labs Reviewed - No data to display  EKG   Radiology No results found.  Procedures Procedures (including critical care time)  Medications Ordered in UC Medications  albuterol  (VENTOLIN  HFA) 108 (90 Base) MCG/ACT inhaler 2 puff (2 puffs Inhalation Given 04/23/24 0840)  AeroChamber Plus Flo-Vu Medium MISC 1 each (1 each Other Given 04/23/24 0840)    Initial Impression / Assessment and Plan / UC Course  I have reviewed the triage vital signs and the nursing notes.  Pertinent labs & imaging results that were available during my care of the patient were reviewed by me and considered in my medical decision making (see chart for details).  Plan of Care: Asthma exacerbation: Huge improvement in breath sounds with albuterol  MDI and spacer dose during the visit.  Renewed all of his inhalers and nebulizer solution and tubing.  See discharge instructions for specifics.  Needs to get connected with family practice for regular asthma management.  May benefit from antihistamines or Singulair.  Follow-up here if symptoms do not improve, worsen or new symptoms occur.  I reviewed the plan of care with the patient and/or  the patient's guardian.  The patient and/or guardian had time to ask questions and acknowledged that the questions were answered.  I provided instruction on symptoms or reasons to return here or to go to an ER, if symptoms/condition did not improve, worsened or if new symptoms occurred.  Final Clinical Impressions(s) / UC Diagnoses   Final diagnoses:  Moderate persistent asthma with acute exacerbation     Discharge Instructions      Asthma exacerbation: Patient ran out of all of his inhalers and did not have nebulizer solution or tubing.  He has been having coughing and some wheezing for about a week.  Breath sounds and oxygen saturation improved significantly with albuterol  inhaler use here in the clinic.  He was given a spacer with the albuterol  inhaler and instructed by the nurse on how to use it.  He had not used a spacer before and felt like it was helpful.  Refilled Symbicort , albuterol  inhaler, DuoNeb nebulizer solution and nebulizer tubing.  Patient already uses these and knows how to use them.  Will get the patient set up with primary care.  He would benefit potentially from maybe asthma and allergy blood testing or referral to an asthma and allergy specialist.  He may benefit from regular use during allergy season in the spring and fall of cetirizine/Zyrtec or loratadine/Claritin and possibly even montelukast/Singulair.  Follow-up here if symptoms do not improve, worsen or new symptoms occur.   ED Prescriptions     Medication Sig Dispense Auth. Provider   budesonide -formoterol  (SYMBICORT ) 80-4.5 MCG/ACT inhaler Inhale 2 puffs into the lungs in the morning and at bedtime. 10.2 g Guss Legacy, FNP   ipratropium-albuterol  (DUONEB) 0.5-2.5 (3) MG/3ML SOLN Take 3 mLs by nebulization every 6 (six) hours as needed (shortness of breath). 360  mL Guss Legacy, FNP   Respiratory Therapy Supplies (NEBULIZER MASK ADULT/TUBING) MISC 1 each by Does not apply route every 4 (four) hours as  needed. 1 each Guss Legacy, FNP   Albuterol -Budesonide  (AIRSUPRA) 90-80 MCG/ACT AERO Inhale 2 puffs into the lungs every 4 (four) hours as needed. 10.7 g Guss Legacy, FNP      PDMP not reviewed this encounter.   Guss Legacy, FNP 04/23/24 [phone]

## 2024-05-07 ENCOUNTER — Encounter (HOSPITAL_COMMUNITY): Payer: Self-pay | Admitting: *Deleted

## 2024-05-07 ENCOUNTER — Telehealth (HOSPITAL_COMMUNITY): Payer: Self-pay | Admitting: *Deleted

## 2024-05-07 MED ORDER — ALBUTEROL SULFATE HFA 108 (90 BASE) MCG/ACT IN AERS: 2.0000 | INHALATION_SPRAY | RESPIRATORY_TRACT | 0 refills | Status: AC | PRN

## 2024-05-07 NOTE — Telephone Encounter (Signed)
 Will send a new prescription for albuterol  inhaler and counseled the [employer] .  Patient to be updated by nursing but her pharmacy should also update her.

## 2024-05-07 NOTE — Telephone Encounter (Signed)
 Called pt to advised an no answer

## 2024-08-15 ENCOUNTER — Encounter (HOSPITAL_COMMUNITY): Payer: Self-pay

## 2024-08-15 ENCOUNTER — Ambulatory Visit (HOSPITAL_COMMUNITY)
Admission: EM | Admit: 2024-08-15 | Discharge: 2024-08-15 | Disposition: A | Discharge status: Home / Self Care | Attending: Emergency Medicine | Admitting: Emergency Medicine

## 2024-08-15 DIAGNOSIS — Z760 Encounter for issue of repeat prescription: Secondary | ICD-10-CM | POA: Diagnosis not present

## 2024-08-15 DIAGNOSIS — J45901 Unspecified asthma with (acute) exacerbation: Secondary | ICD-10-CM | POA: Diagnosis not present

## 2024-08-15 MED ORDER — ALBUTEROL SULFATE HFA 108 (90 BASE) MCG/ACT IN AERS
2.0000 | INHALATION_SPRAY | Freq: Once | RESPIRATORY_TRACT | Status: AC
  Administered 2024-08-15: 2 via RESPIRATORY_TRACT

## 2024-08-15 MED ORDER — ALBUTEROL SULFATE HFA 108 (90 BASE) MCG/ACT IN AERS
INHALATION_SPRAY | RESPIRATORY_TRACT | Status: AC
  Filled 2024-08-15: qty 6.7

## 2024-08-15 NOTE — Discharge Instructions (Signed)
 Use the albuterol  inhaler every 4-6 hours as needed for wheezing or shortness of breath.    Follow-up with the primary care provider listed on your Medicaid card for further refills and management of your asthma.  Return to clinic for new or urgent symptoms.

## 2024-08-15 NOTE — ED Triage Notes (Signed)
 Patient here today with c/o SOB X 1.5 days. Patient states that he has asthma and ran out of his Albuterol  2 days ago. Patient requesting refill for this. Denies other symptoms.

## 2024-08-15 NOTE — ED Provider Notes (Signed)
 [facility]    CSN: [id number] Arrival date & time: 08/15/24  1121      History   Chief Complaint Chief Complaint  Patient presents with   Shortness of Breath    HPI Thomas Houston is a 33 y.o. male.   Patient presents to clinic over concern of wheezing and shortness of breath.  Has a history of asthma and recently ran out of his albuterol  inhaler 2 days prior, triggering his wheezing and shortness of breath.  Reports he works in [employer] and is constantly moving around and in the humid environment, albuterol  helps control his symptoms.  Does not currently have a primary care provider.  The history is provided by the patient and medical records.  Shortness of Breath   Past Medical History:  Diagnosis Date   Asthma     There are no active problems to display for this patient.   History reviewed. No pertinent surgical history.     Home Medications    Prior to Admission medications   Medication Sig Start Date End Date Taking? Authorizing Provider  albuterol  (VENTOLIN  HFA) 108 (90 Base) MCG/ACT inhaler Inhale 2 puffs into the lungs every 4 (four) hours as needed for wheezing. 05/07/24   Ival Domino, FNP  Albuterol -Budesonide  (AIRSUPRA ) 90-80 MCG/ACT AERO Inhale 2 puffs into the lungs every 4 (four) hours as needed. 04/23/24   Ival Domino, FNP  budesonide -formoterol  (SYMBICORT ) 80-4.5 MCG/ACT inhaler Inhale 2 puffs into the lungs in the morning and at bedtime. 04/23/24   Ival Domino, FNP  ipratropium-albuterol  (DUONEB) 0.5-2.5 (3) MG/3ML SOLN Take 3 mLs by nebulization every 6 (six) hours as needed (shortness of breath). 04/23/24   Ival Domino, FNP  Respiratory Therapy Supplies (NEBULIZER MASK ADULT/TUBING) MISC 1 each by Does not apply route every 4 (four) hours as needed. 04/23/24   Ival Domino, FNP    Family History Family History  Problem Relation Age of Onset   Asthma Mother    Hypertension Mother     Social History Social History    Tobacco Use   Smoking status: Former    Current packs/day: 3.00    Types: Cigarettes    Passive exposure: Current   Smokeless tobacco: Never  Vaping Use   Vaping status: Never Used  Substance Use Topics   Alcohol use: No   Drug use: No     Allergies   Other   Review of Systems Review of Systems  Per HPI  Physical Exam Triage Vital Signs ED Triage Vitals  Encounter Vitals Group     BP 08/15/24 1156 (!) 146/93     Girls Systolic BP Percentile --      Girls Diastolic BP Percentile --      Boys Systolic BP Percentile --      Boys Diastolic BP Percentile --      Pulse Rate 08/15/24 1156 76     Resp 08/15/24 1156 16     Temp 08/15/24 1156 98.2 F (36.8 C)     Temp Source 08/15/24 1156 Oral     SpO2 08/15/24 1156 94 %     Weight --      Height --      Head Circumference --      Peak Flow --      Pain Score 08/15/24 1158 0     Pain Loc --      Pain Education --      Exclude from Growth Chart --    No  data found.  Updated Vital Signs BP (!) 146/93 (BP Location: Right Arm)   Pulse 76   Temp 98.2 F (36.8 C) (Oral)   Resp 16   SpO2 94%   Visual Acuity Right Eye Distance:   Left Eye Distance:   Bilateral Distance:    Right Eye Near:   Left Eye Near:    Bilateral Near:     Physical Exam Vitals and nursing note reviewed.  Constitutional:      Appearance: He is well-developed.  HENT:     Head: Normocephalic and atraumatic.     Mouth/Throat:     Mouth: Mucous membranes are moist.  Cardiovascular:     Rate and Rhythm: Normal rate and regular rhythm.  Pulmonary:     Effort: Pulmonary effort is normal.     Breath sounds: No decreased breath sounds, wheezing or rhonchi.  Musculoskeletal:     Cervical back: Normal range of motion.  Skin:    General: Skin is warm and dry.  Neurological:     General: No focal deficit present.     Mental Status: He is alert and oriented to person, place, and time.  Psychiatric:        Mood and Affect: Mood normal.         Behavior: Behavior normal.      UC Treatments / Results  Labs (all labs ordered are listed, but only abnormal results are displayed) Labs Reviewed - No data to display  EKG   Radiology No results found.  Procedures Procedures (including critical care time)  Medications Ordered in UC Medications  albuterol  (VENTOLIN  HFA) 108 (90 Base) MCG/ACT inhaler 2 puff (2 puffs Inhalation Given 08/15/24 1255)    Initial Impression / Assessment and Plan / UC Course  I have reviewed the triage vital signs and the nursing notes.  Pertinent labs & imaging results that were available during my care of the patient were reviewed by me and considered in my medical decision making (see chart for details).  Vitals and triage reviewed, patient is hemodynamically stable.  Lungs vesicular, heart with regular rate and rhythm.  Oxygenation 98% on room air, no acute distress.  Will provide with albuterol  inhaler here in clinic, encouraged PCP follow-up for further asthma management.  Patient verbalized understanding, no questions at this time.  Work note provided.     Final Clinical Impressions(s) / UC Diagnoses   Final diagnoses:  Mild asthma with acute exacerbation, unspecified whether persistent  Medication refill     Discharge Instructions      Use the albuterol  inhaler every 4-6 hours as needed for wheezing or shortness of breath.    Follow-up with the primary care provider listed on your Medicaid card for further refills and management of your asthma.  Return to clinic for new or urgent symptoms.    ED Prescriptions   None    PDMP not reviewed this encounter.   Dreama, Angelyn Osterberg  N, FNP 08/15/24 1258
